# Patient Record
Sex: Female | Born: 1985 | Race: Black or African American | Hispanic: No | Marital: Single | Smoking: Former smoker
Health system: Southern US, Community
[De-identification: ages and names within clinical notes are randomized; demographics above are authoritative.]

## PROBLEM LIST (undated history)

## (undated) DIAGNOSIS — Z8041 Family history of malignant neoplasm of ovary: Secondary | ICD-10-CM

## (undated) DIAGNOSIS — T7840XA Allergy, unspecified, initial encounter: Secondary | ICD-10-CM

## (undated) DIAGNOSIS — I10 Essential (primary) hypertension: Secondary | ICD-10-CM

## (undated) DIAGNOSIS — F419 Anxiety disorder, unspecified: Secondary | ICD-10-CM

## (undated) DIAGNOSIS — K219 Gastro-esophageal reflux disease without esophagitis: Secondary | ICD-10-CM

## (undated) DIAGNOSIS — J45909 Unspecified asthma, uncomplicated: Secondary | ICD-10-CM

## (undated) HISTORY — DX: Gastro-esophageal reflux disease without esophagitis: K21.9

## (undated) HISTORY — DX: Allergy, unspecified, initial encounter: T78.40XA

## (undated) HISTORY — DX: Unspecified asthma, uncomplicated: J45.909

## (undated) HISTORY — DX: Family history of malignant neoplasm of ovary: Z80.41

## (undated) HISTORY — DX: Anxiety disorder, unspecified: F41.9

## (undated) HISTORY — DX: Essential (primary) hypertension: I10

---
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
## 2006-02-09 ENCOUNTER — Emergency Department (HOSPITAL_COMMUNITY): Admission: EM | Admit: 2006-02-09 | Discharge: 2006-02-09 | Payer: Self-pay | Admitting: Family Medicine

## 2006-03-05 ENCOUNTER — Other Ambulatory Visit: Admission: RE | Admit: 2006-03-05 | Discharge: 2006-03-05 | Payer: Self-pay | Admitting: Obstetrics and Gynecology

## 2006-06-20 ENCOUNTER — Emergency Department (HOSPITAL_COMMUNITY): Admission: EM | Admit: 2006-06-20 | Discharge: 2006-06-21 | Payer: Self-pay | Admitting: Emergency Medicine

## 2006-07-29 ENCOUNTER — Other Ambulatory Visit: Admission: RE | Admit: 2006-07-29 | Discharge: 2006-07-29 | Payer: Self-pay | Admitting: Obstetrics and Gynecology

## 2007-04-12 ENCOUNTER — Emergency Department (HOSPITAL_COMMUNITY): Admission: EM | Admit: 2007-04-12 | Discharge: 2007-04-12 | Payer: Self-pay | Admitting: Emergency Medicine

## 2007-07-30 IMAGING — CR DG CHEST 2V
2 series · 2 of 2 positions shown · non-contrast
Comparison: None.

CLINICAL DATA: Sore throat and cough.
 CHEST - 2 VIEW:

[w chest pa]
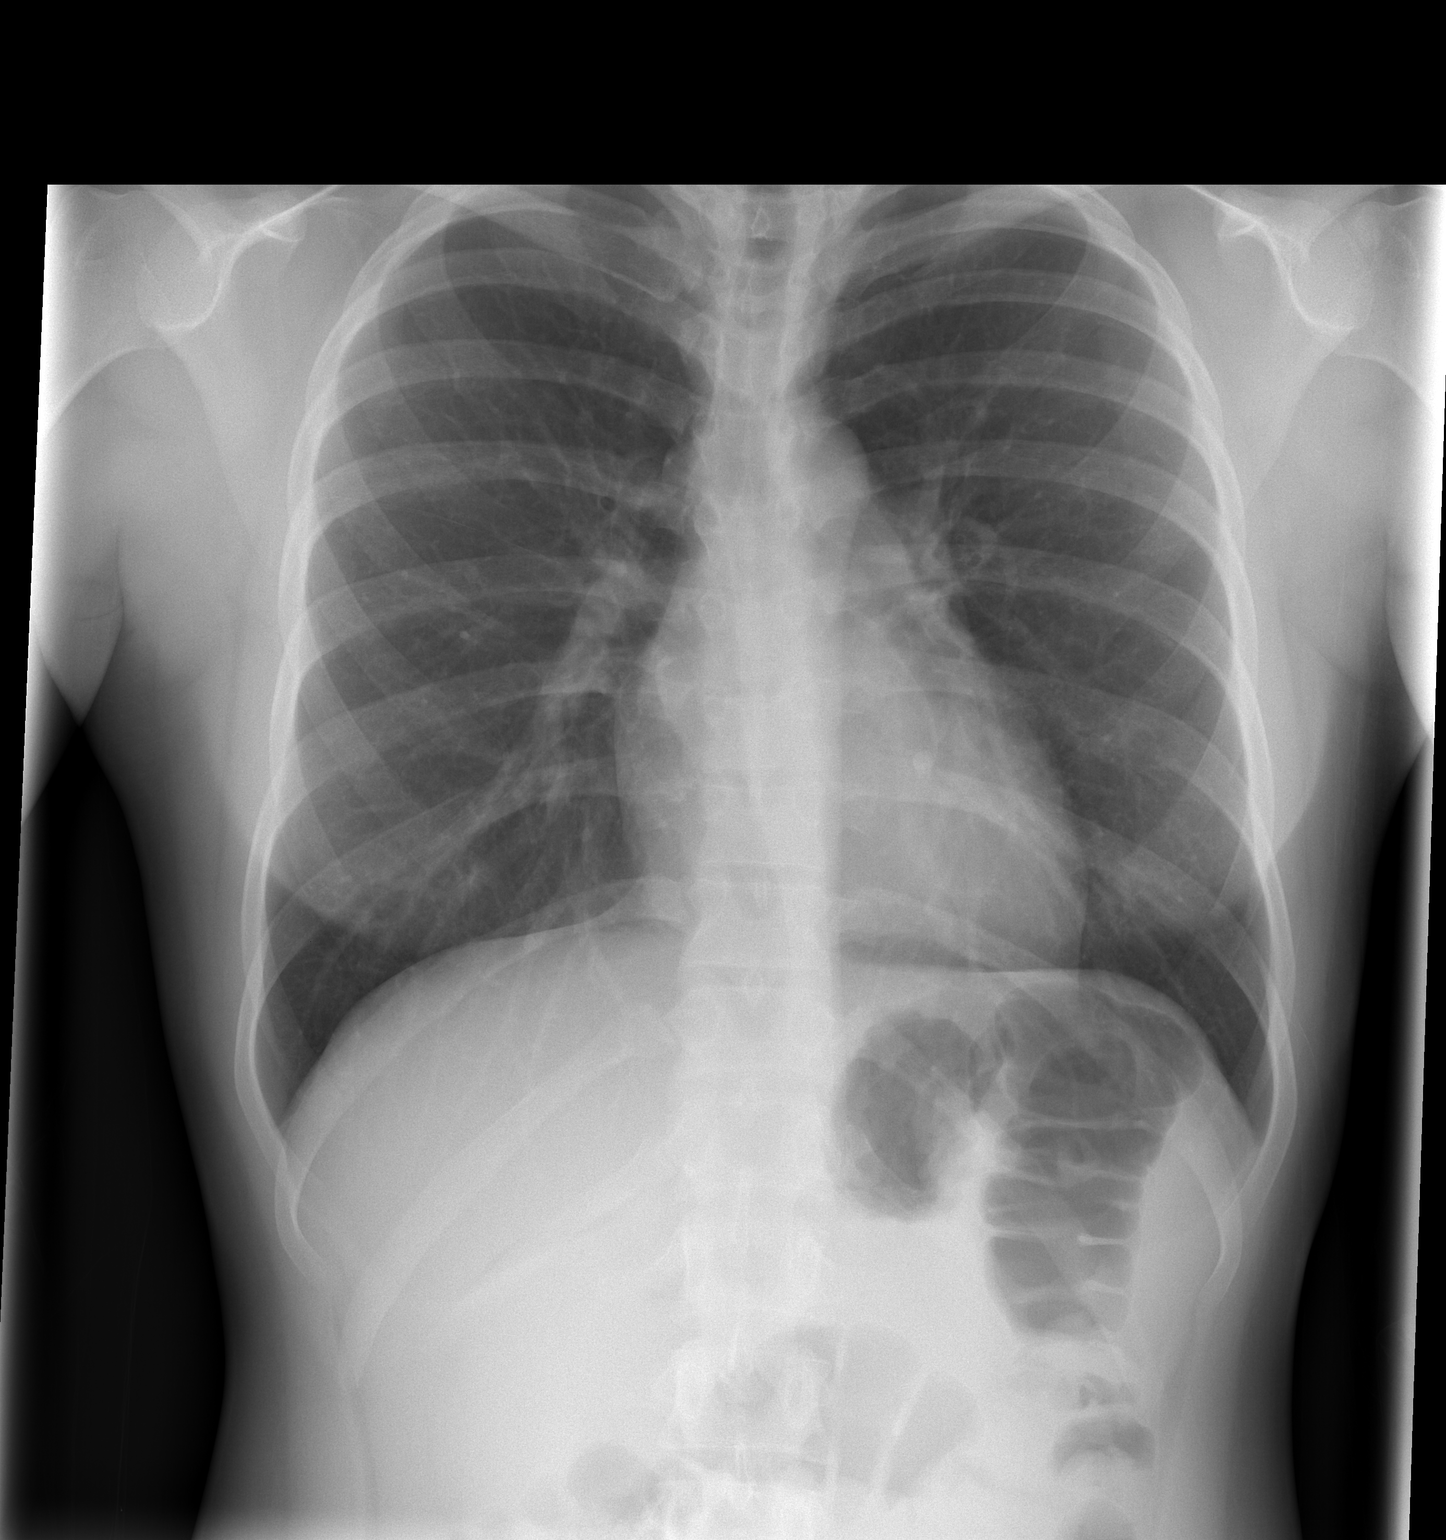

[w chest lat]
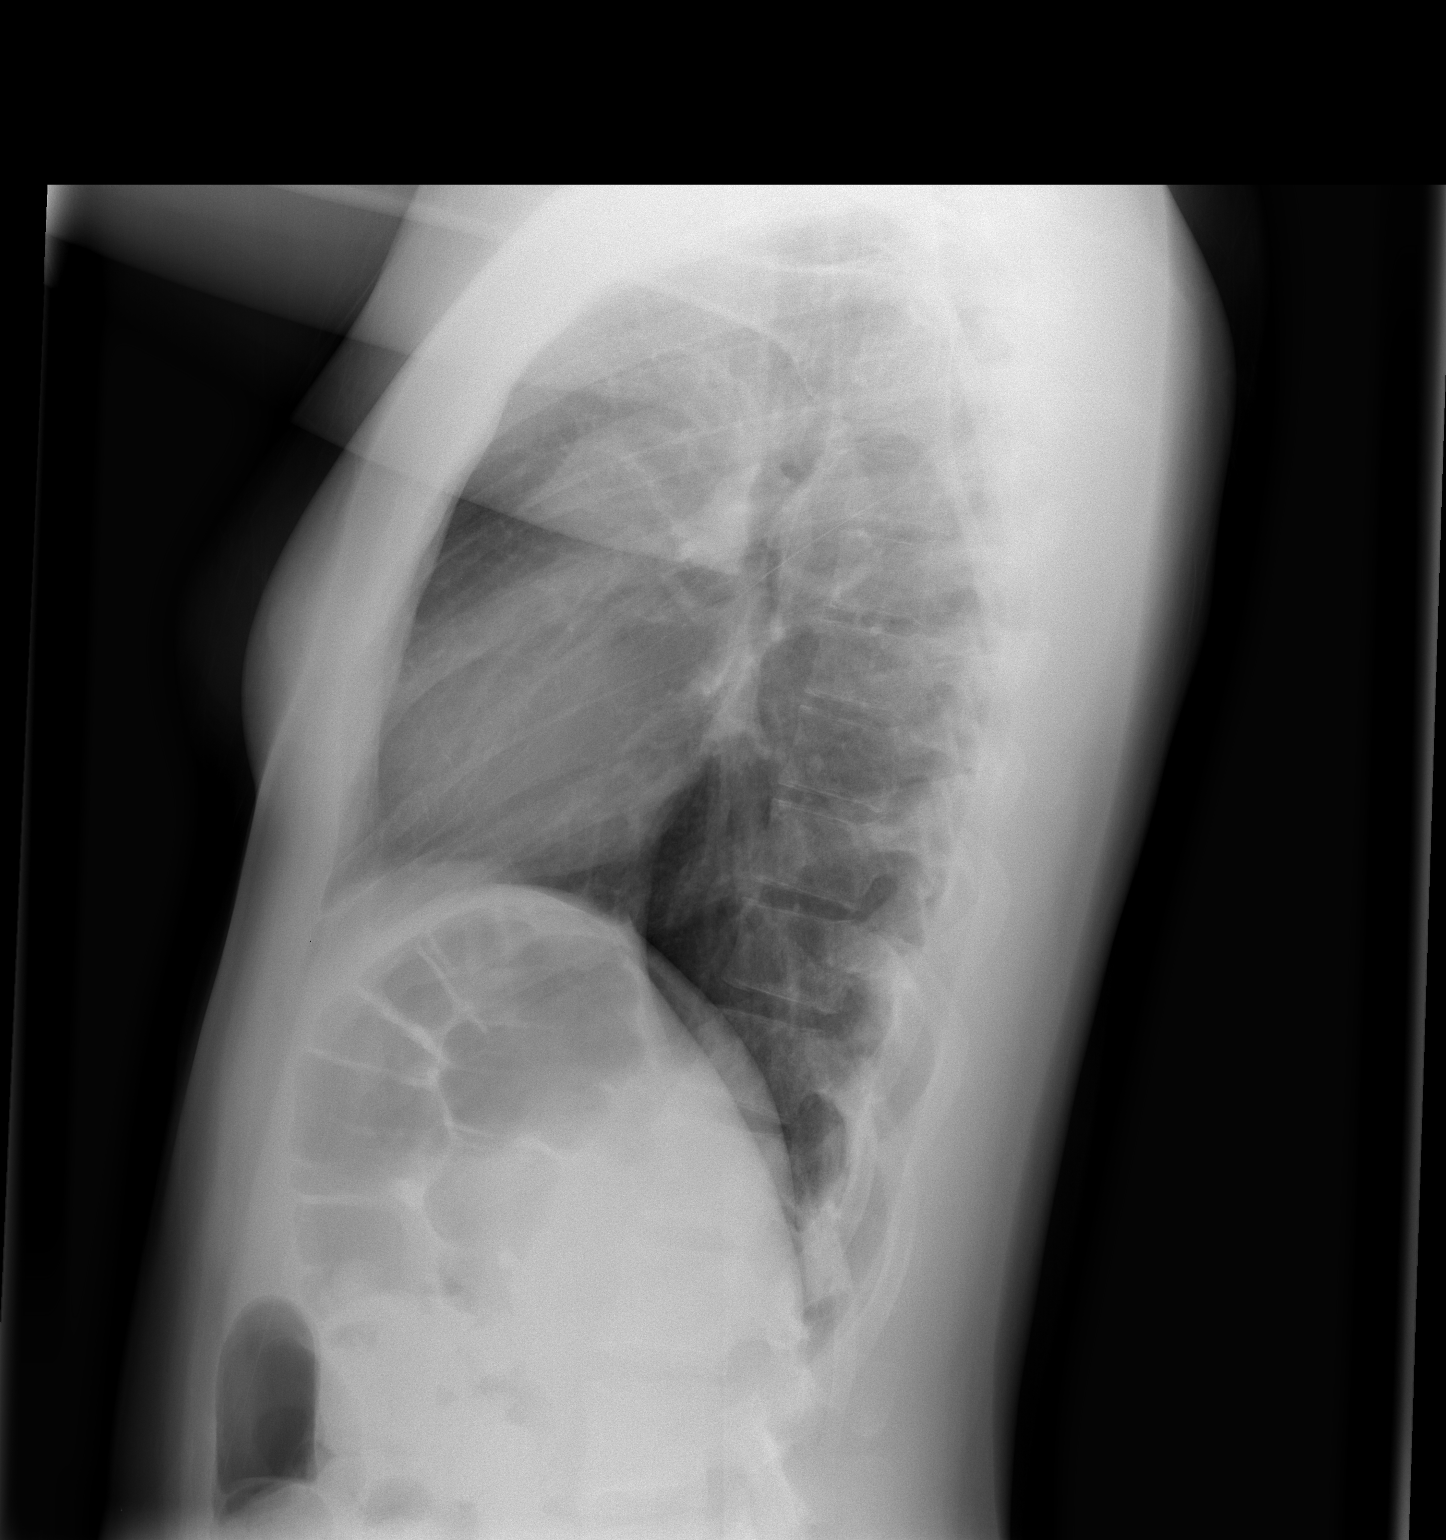

[2 of 2 positions shown; findings below may reference images not displayed]

FINDINGS: Heart size normal.  No effusions or edema.  There are no air space opacities identified.  Review of the visualized osseous structures is unremarkable.
IMPRESSION: No active cardiopulmonary disease.

## 2012-03-21 DEATH — deceased

## 2013-09-01 ENCOUNTER — Ambulatory Visit
Admission: RE | Admit: 2013-09-01 | Discharge: 2013-09-01 | Disposition: A | Discharge status: Home / Self Care | Payer: Commercial Indemnity | Source: Ambulatory Visit | Attending: Physician Assistant | Admitting: Physician Assistant

## 2013-09-01 ENCOUNTER — Other Ambulatory Visit: Payer: Self-pay | Admitting: Physician Assistant

## 2013-09-01 DIAGNOSIS — R52 Pain, unspecified: Secondary | ICD-10-CM

## 2021-04-08 ENCOUNTER — Encounter: Payer: Self-pay | Admitting: Family Medicine

## 2021-04-08 ENCOUNTER — Ambulatory Visit: Payer: No Typology Code available for payment source | Admitting: Family Medicine

## 2021-04-08 ENCOUNTER — Other Ambulatory Visit: Payer: Self-pay

## 2021-04-08 VITALS — BP 122/94 | HR 97 | Temp 98.8°F | Ht 67.0 in | Wt 250.0 lb

## 2021-04-08 DIAGNOSIS — G4733 Obstructive sleep apnea (adult) (pediatric): Secondary | ICD-10-CM | POA: Insufficient documentation

## 2021-04-08 DIAGNOSIS — F419 Anxiety disorder, unspecified: Secondary | ICD-10-CM

## 2021-04-08 DIAGNOSIS — F32A Depression, unspecified: Secondary | ICD-10-CM | POA: Diagnosis not present

## 2021-04-08 DIAGNOSIS — G478 Other sleep disorders: Secondary | ICD-10-CM

## 2021-04-08 NOTE — Progress Notes (Signed)
Primary Care / Sports Medicine Office Visit  Patient Information:  Patient ID: Jodi Conner, female DOB: [birth year] Age: 35 y.o. MRN: [id number]   Jodi Conner is a pleasant 35 y.o. female presenting with the following:  Chief Complaint  Patient presents with   New Patient (Initial Visit)   Establish Care   Anxiety    Taking Ashwaganda with some relief   Insomnia    Has taken melatonin in the past with no relief    Review of Systems pertinent details above   Patient Active Problem List   Diagnosis Date Noted   Anxiety and depression 04/08/2021   Awakens from sleep at night 04/08/2021   Past Medical History:  Diagnosis Date   Allergy    GERD (gastroesophageal reflux disease)    Outpatient Encounter Medications as of 04/08/2021  Medication Sig   ASHWAGANDHA PO Take 2 capsules by mouth daily.   esomeprazole (NEXIUM) 20 MG capsule Take 20 mg by mouth daily at 12 noon.   fexofenadine (ALLEGRA) 180 MG tablet Take 180 mg by mouth daily.   Multiple Vitamins-Minerals (HAIR SKIN AND NAILS FORMULA PO) Take 2 capsules by mouth daily.   No facility-administered encounter medications on file as of 04/08/2021.   History reviewed. No pertinent surgical history.  Vitals:   04/08/21 0943  BP: (!) 122/94  Pulse: 97  Temp: 98.8 F (37.1 C)  SpO2: 98%   Vitals:   04/08/21 0943  Weight: 250 lb (113.4 kg)  Height: 5\' 7"  (1.702 m)   Body mass index is 39.16 kg/m.  No results found.   Independent interpretation of notes and tests performed by another provider:   None  Procedures performed:   None  Pertinent History, Exam, Impression, and Recommendations:   Anxiety and depression GAD 7 : Generalized Anxiety Score 04/08/2021  Nervous, Anxious, on Edge 1  Control/stop worrying 1  Worry too much - different things 1  Trouble relaxing 1  Restless 0  Easily annoyed or irritable 1  Afraid - awful might happen 1  Total GAD 7 Score 6  Anxiety Difficulty Somewhat  difficult     Depression screen PHQ 2/9 04/08/2021  Decreased Interest 1  Down, Depressed, Hopeless 0  PHQ - 2 Score 1  Altered sleeping 3  Tired, decreased energy 1  Change in appetite 1  Feeling bad or failure about yourself  0  Trouble concentrating 0  Moving slowly or fidgety/restless 0  Suicidal thoughts 0  PHQ-9 Score 6  Difficult doing work/chores Somewhat difficult   Patient with above GAD and course, feel that symptoms have progressively worsened over the past few years, primary concern includes sleep, intrusive thoughts noted as well.  We reviewed various strategies both pharmacologic and nonpharmacologic, at this stage she is amenable to cognitive behavioral therapy.  Information for local resources was provided, mindfulness information provided as well.  She is to continue the OTC ashwagandha in the interim. We will track this issue and modify plan accordingly.  Awakens from sleep at night Patient with difficulty with sleep initiation due to racing thoughts, additionally multiple episodes of nighttime awakenings, has been told that she snores by family members.  Epworth score below noted though given her overall clinical picture I have advised further evaluation by ENT/sleep medicine for evaluation for OSA versus additional sleep pathology.  Cardiopulmonary findings were benign today.  We will coordinate a follow-up after she has seen ENT/sleep.  Results of the Epworth flowsheet 04/08/2021  Sitting and reading  1  Watching TV 2  Sitting, inactive in a public place (e.g. a theatre or a meeting) 0  As a passenger in a car for an hour without a break 3  Lying down to rest in the afternoon when circumstances permit 1  Sitting and talking to someone 1  Sitting quietly after a lunch without alcohol 1  In a car, while stopped for a few minutes in traffic 0  Total score 9     Orders & Medications No orders of the defined types were placed in this encounter.  Orders Placed This  Encounter  Procedures   Ambulatory referral to ENT     No follow-ups on file.     Jerrol Banana, MD   Primary Care Sports Medicine [facility]

## 2021-04-08 NOTE — Assessment & Plan Note (Addendum)
GAD 7 : Generalized Anxiety Score 04/08/2021  Nervous, Anxious, on Edge 1  Control/stop worrying 1  Worry too much - different things 1  Trouble relaxing 1  Restless 0  Easily annoyed or irritable 1  Afraid - awful might happen 1  Total GAD 7 Score 6  Anxiety Difficulty Somewhat difficult     Depression screen PHQ 2/9 04/08/2021  Decreased Interest 1  Down, Depressed, Hopeless 0  PHQ - 2 Score 1  Altered sleeping 3  Tired, decreased energy 1  Change in appetite 1  Feeling bad or failure about yourself  0  Trouble concentrating 0  Moving slowly or fidgety/restless 0  Suicidal thoughts 0  PHQ-9 Score 6  Difficult doing work/chores Somewhat difficult   Patient with above GAD and course, feel that symptoms have progressively worsened over the past few years, primary concern includes sleep, intrusive thoughts noted as well.  We reviewed various strategies both pharmacologic and nonpharmacologic, at this stage she is amenable to cognitive behavioral therapy.  Information for local resources was provided, mindfulness information provided as well.  She is to continue the OTC ashwagandha in the interim. We will track this issue and modify plan accordingly.

## 2021-04-08 NOTE — Assessment & Plan Note (Signed)
Patient with difficulty with sleep initiation due to racing thoughts, additionally multiple episodes of nighttime awakenings, has been told that she snores by family members.  Epworth score below noted though given her overall clinical picture I have advised further evaluation by ENT/sleep medicine for evaluation for OSA versus additional sleep pathology.  Cardiopulmonary findings were benign today.  We will coordinate a follow-up after she has seen ENT/sleep.  Results of the Epworth flowsheet 04/08/2021  Sitting and reading 1  Watching TV 2  Sitting, inactive in a public place (e.g. a theatre or a meeting) 0  As a passenger in a car for an hour without a break 3  Lying down to rest in the afternoon when circumstances permit 1  Sitting and talking to someone 1  Sitting quietly after a lunch without alcohol 1  In a car, while stopped for a few minutes in traffic 0  Total score 9

## 2021-04-08 NOTE — Patient Instructions (Addendum)
Please contact to schedule an appointment:  Behavioral Health Resources:  [facility]: [phone] [facility]: [phone] [facility]: [phone] [place] [employer]: [phone] [facility]: [phone] [facility]: [phone]  Vernie Murders MD ENT / Sleep Medicine [address]  [phone]  [employer]: [phone] [facility] : [phone]   Mindfulness-Based Stress Reduction Mindfulness-based stress reduction (MBSR) is a program that helps you learn to practice mindfulness. Mindfulness is the practice of intentionally paying attention to the present moment. MBSR focuses on developing self-awareness, which allows you to respond to life stress without judgment or negative emotions. It can be learned and practiced through techniques such as education, breathing exercises, meditation, and yoga. MBSR includes several mindfulness techniques in one program. MBSR works best when you understand the treatment, are willing to try new things, and can commit to spending time practicing what you learn.  What are the Benefits of MBSR? MBSR has been shown to have many benefits, which include helping you to: Reduce negative emotions, such as depression and anxiety. Improve memory and focus. Change how you sense and approach pain. Boost your body's ability to fight infections. Help you connect better with other people. Improve your sense of well-being. Follow These Instructions at Home:  Find a local in-person or online MBSR program. Set aside some time regularly for mindfulness practice. Find a mindfulness practice that works best for you. This may include one or more of the following: Meditation. Meditation involves focusing your mind on a certain thought or activity. Breathing awareness exercises. These help you to stay present by focusing on your breath. Body scan. For this  practice, you lie down and pay attention to each part of your body from head to toe. You can identify tension and soreness and intentionally relax parts of your body. Yoga. Yoga involves stretching and breathing, and it can improve your ability to move and be flexible. It can also provide an experience of testing your body's limits, which can help you release stress. Mindful eating. This way of eating involves focusing on the taste, texture, color, and smell of each bite of food. Because this slows down eating and helps you feel full sooner, it can be an important part of a weight-loss plan. Find a podcast or recording that provides guidance for breathing awareness, body scan, or meditation exercises. You can listen to these any time when you have a free moment to rest without distractions. Follow your treatment plan as told by your health care provider. This may include taking regular medicines and making changes to your diet or lifestyle as recommended. How to Practice Mindfulness: To do a basic awareness exercise: Find a comfortable place to sit or lay down. Pay attention to the present moment. Observe your thoughts, feelings, and surroundings just as they are. Avoid placing judgment on yourself, your feelings, or your surroundings. Make note of any judgment that comes up, and let it go. Your mind may wander, and that is okay. Make note of when your thoughts drift, and return your attention to the present moment. To do basic mindfulness meditation: Find a comfortable place to sit or lay down. This may include a stable chair, your bed, or a firm floor cushion. Sit upright with your back straight. Let your arms fall next to your side with your hands resting on your legs. If sitting in a chair, rest your feet flat on the floor. If sitting on a cushion, cross your legs in  front of you. If laying down, let your limbs fully relax. Keep your head in a neutral position with your chin dropped slightly.  Relax your jaw and rest the tip of your tongue on the roof of your mouth. Drop your gaze to the floor. You can close your eyes if you like. Breathe normally and pay attention to your breath. Feel the air moving in and out of your nose. Feel your belly expanding and relaxing with each breath. Your mind may wander, and that is okay. Make note of when your thoughts drift, and return your attention to your breath. Avoid placing judgment on yourself, your feelings, or your surroundings. Make note of any judgment or feelings that come up, let them go, and bring your attention back to your breath. When you are ready, lift your gaze or open your eyes. Pay attention to how your body feels after the meditation.

## 2021-04-14 ENCOUNTER — Encounter: Payer: Self-pay | Admitting: Family Medicine

## 2021-04-14 ENCOUNTER — Ambulatory Visit (INDEPENDENT_AMBULATORY_CARE_PROVIDER_SITE_OTHER): Payer: No Typology Code available for payment source | Admitting: Family Medicine

## 2021-04-14 ENCOUNTER — Other Ambulatory Visit: Payer: Self-pay

## 2021-04-14 VITALS — BP 130/84 | HR 94 | Temp 98.7°F | Ht 67.0 in | Wt 250.0 lb

## 2021-04-14 DIAGNOSIS — J309 Allergic rhinitis, unspecified: Secondary | ICD-10-CM | POA: Diagnosis not present

## 2021-04-14 DIAGNOSIS — Z0000 Encounter for general adult medical examination without abnormal findings: Secondary | ICD-10-CM | POA: Diagnosis not present

## 2021-04-14 DIAGNOSIS — R7989 Other specified abnormal findings of blood chemistry: Secondary | ICD-10-CM | POA: Diagnosis not present

## 2021-04-14 DIAGNOSIS — Z23 Encounter for immunization: Secondary | ICD-10-CM | POA: Diagnosis not present

## 2021-04-14 DIAGNOSIS — Z13220 Encounter for screening for lipoid disorders: Secondary | ICD-10-CM | POA: Diagnosis not present

## 2021-04-14 NOTE — Assessment & Plan Note (Signed)
Mildly symptomatic, advised restart of Flonase and Allegra x 2 weeks, then PRN. Has ENT visit for OSA evaluation.

## 2021-04-14 NOTE — Patient Instructions (Signed)
-   Restart Flonas and Allegra, dose daily x 2 weeks - Maintain follow-up with ENT / Sleep Medicine and behavioral health - Obtain labs with orders provided - Our office will contact you with results and we will coordinate follow-up to review the above - Contact us for any questions

## 2021-04-14 NOTE — Progress Notes (Signed)
Annual Physical Exam Visit  Patient Information:  Patient ID: Jodi Conner, female DOB: Aug 24, 1986 Age: 35 y.o. MRN: [id number]   Subjective:   CC: Annual Physical Exam  HPI:  Jodi Conner is here for their annual physical.  I reviewed the past medical history, family history, social history, surgical history, and allergies today and changes were made as necessary.  Please see the problem list section below for additional details.  Past Medical History: Past Medical History:  Diagnosis Date   Allergy    GERD (gastroesophageal reflux disease)    Past Surgical History: History reviewed. No pertinent surgical history. Social History: Social History   Socioeconomic History   Marital status: Single    Spouse name: Not on file   Number of children: 0   Years of education: 16   Highest education level: Bachelor's degree (e.g., BA, AB, BS)  Occupational History   Occupation: Network engineer  Tobacco Use   Smoking status: Former    Types: Cigars   Smokeless tobacco: Never  Vaping Use   Vaping Use: Never used  Substance and Sexual Activity   Alcohol use: Yes    Alcohol/week: 2.0 standard drinks    Types: 2 Standard drinks or equivalent per week   Drug use: Never   Sexual activity: Yes    Birth control/protection: Condom  Other Topics Concern   Not on file  Social History Narrative   Not on file   Social Determinants of Health   Financial Resource Strain: Not on file  Food Insecurity: Not on file  Transportation Needs: Not on file  Physical Activity: Not on file  Stress: Not on file  Social Connections: Not on file   Family History: Family History  Problem Relation Age of Onset   Alcohol abuse Mother    Cervical cancer Mother    Drug abuse Mother    Alcohol abuse Father    Cancer Maternal Grandmother    Allergies: No Known Allergies Health Maintenance: Health Maintenance  Topic Date Due   HIV Screening  Never done   Hepatitis C  Screening  Never done   PAP SMEAR-Modifier  Never done   COVID-19 Vaccine (3 - Booster for Pfizer series) 05/27/2020   INFLUENZA VACCINE  04/21/2021   TETANUS/TDAP  04/15/2031   Pneumococcal Vaccine 66-62 Years old  Aged Out   HPV VACCINES  Aged Out    HM Colonoscopy     This patient has no relevant Health Maintenance data.      Medications: Current Outpatient Medications on File Prior to Visit  Medication Sig Dispense Refill   ASHWAGANDHA PO Take 2 capsules by mouth daily.     esomeprazole (NEXIUM) 20 MG capsule Take 20 mg by mouth daily at 12 noon.     fexofenadine (ALLEGRA) 180 MG tablet Take 180 mg by mouth daily.     Multiple Vitamins-Minerals (HAIR SKIN AND NAILS FORMULA PO) Take 2 capsules by mouth daily.     No current facility-administered medications on file prior to visit.    Review of Systems: No headache, visual changes, nausea, vomiting, diarrhea, constipation, dizziness, abdominal pain, skin rash, fevers, chills, night sweats, swollen lymph nodes, weight loss, chest pain, body aches, joint swelling, muscle aches, +shortness of breath, +allergies, mood changes, visual or auditory hallucinations reported.  Objective:   Vitals:   04/14/21 0819  BP: 130/84  Pulse: 94  Temp: 98.7 F (37.1 C)  SpO2: 100%   Vitals:   04/14/21 7741  Weight: 250 lb (113.4 kg)  Height: 5\' 7"  (1.702 m)   Body mass index is 39.16 kg/m.  General: Well Developed, well nourished, and in no acute distress.  Neuro: Alert and oriented x3, extra-ocular muscles intact, sensation grossly intact. Cranial nerves II through XII are intact, motor, sensory, and coordinative functions are all intact. HEENT: Normocephalic, atraumatic, pupils equal round reactive to light, neck supple, no masses, nontender mildly swollen LN right anterior cervical chain, thyroid nonpalpable. Oropharynx, nasopharynx, external ear canals are unremarkable. Skin: Warm and dry, no rashes noted.  Cardiac: Regular rate and  rhythm, no murmurs rubs or gallops. No peripheral edema. Pulses symmetric. Respiratory: Trace coarse breath sounds upper fields, post-cough clear to auscultation bilaterally. Not using accessory muscles, speaking in full sentences.  Abdominal: Soft, nontender, nondistended, positive bowel sounds, no masses, no organomegaly.  Musculoskeletal: Shoulder, elbow, wrist, hip, knee, ankle stable, and with full range of motion. Mild tenderness pectoralis major/minor origin bilaterally.  Female chaperone initials: BN present throughout the physical examination.  Impression and Recommendations:   The patient was counselled, risk factors were discussed, and anticipatory guidance given.  Allergic rhinitis Mildly symptomatic, advised restart of Flonase and Allegra x 2 weeks, then PRN. Has ENT visit for OSA evaluation.  Encounter for annual physical exam Annual physical exam completed, anticipatory guidance provided, and risk stratification labs ordered. Will follow as clinically guided. Does have ENT / sleep medicine OSA evaluation pending. Does have behavioral health follow-up pending.   Orders & Medications No orders of the defined types were placed in this encounter.  Orders Placed This Encounter  Procedures   Tdap vaccine greater than or equal to 7yo IM   Lipid panel   CBC   Comprehensive metabolic panel   VITAMIN D 25 Hydroxy (Vit-D Deficiency, Fractures)   TSH Rfx on Abnormal to Free T4   HIV antibody (with reflex)   Hepatitis C Antibody     Return if symptoms worsen or fail to improve.    , MD   Primary Care Sports Medicine [facility]

## 2021-04-14 NOTE — Assessment & Plan Note (Signed)
Annual physical exam completed, anticipatory guidance provided, and risk stratification labs ordered. Will follow as clinically guided. Does have ENT / sleep medicine OSA evaluation pending. Does have behavioral health follow-up pending.

## 2021-04-15 ENCOUNTER — Other Ambulatory Visit: Payer: Self-pay | Admitting: Family Medicine

## 2021-04-15 ENCOUNTER — Telehealth: Payer: Self-pay

## 2021-04-15 DIAGNOSIS — R718 Other abnormality of red blood cells: Secondary | ICD-10-CM

## 2021-04-15 DIAGNOSIS — R7301 Impaired fasting glucose: Secondary | ICD-10-CM

## 2021-04-15 DIAGNOSIS — R7989 Other specified abnormal findings of blood chemistry: Secondary | ICD-10-CM

## 2021-04-15 LAB — CBC
Hematocrit: 33.9 % — ABNORMAL LOW (ref 34.0–46.6)
Hemoglobin: 11 g/dL — ABNORMAL LOW (ref 11.1–15.9)
MCH: 25.4 pg — ABNORMAL LOW (ref 26.6–33.0)
MCHC: 32.4 g/dL (ref 31.5–35.7)
MCV: 78 fL — ABNORMAL LOW (ref 79–97)
Platelets: 417 10*3/uL (ref 150–450)
RBC: 4.33 x10E6/uL (ref 3.77–5.28)
RDW: 15.8 % — ABNORMAL HIGH (ref 11.7–15.4)
WBC: 8.8 10*3/uL (ref 3.4–10.8)

## 2021-04-15 LAB — COMPREHENSIVE METABOLIC PANEL
ALT: 15 IU/L (ref 0–32)
AST: 14 IU/L (ref 0–40)
Albumin/Globulin Ratio: 1.5 (ref 1.2–2.2)
Albumin: 4.1 g/dL (ref 3.8–4.8)
Alkaline Phosphatase: 67 IU/L (ref 44–121)
BUN/Creatinine Ratio: 14 (ref 9–23)
BUN: 12 mg/dL (ref 6–20)
Bilirubin Total: <0.2 mg/dL (ref 0.0–1.2)
CO2: 23 mmol/L (ref 20–29)
Calcium: 9 mg/dL (ref 8.7–10.2)
Chloride: 104 mmol/L (ref 96–106)
Creatinine, Ser: 0.86 mg/dL (ref 0.57–1.00)
Globulin, Total: 2.8 g/dL (ref 1.5–4.5)
Glucose: 111 mg/dL — ABNORMAL HIGH (ref 65–99)
Potassium: 4.3 mmol/L (ref 3.5–5.2)
Sodium: 140 mmol/L (ref 134–144)
Total Protein: 6.9 g/dL (ref 6.0–8.5)
eGFR: 91 mL/min/{1.73_m2} (ref >59)

## 2021-04-15 LAB — LIPID PANEL
Chol/HDL Ratio: 5.1 ratio — ABNORMAL HIGH (ref 0.0–4.4)
Cholesterol, Total: 159 mg/dL (ref 100–199)
HDL: 31 mg/dL — ABNORMAL LOW (ref >39)
LDL Chol Calc (NIH): 107 mg/dL — ABNORMAL HIGH (ref 0–99)
Triglycerides: 113 mg/dL (ref 0–149)
VLDL Cholesterol Cal: 21 mg/dL (ref 5–40)

## 2021-04-15 LAB — VITAMIN D 25 HYDROXY (VIT D DEFICIENCY, FRACTURES): Vit D, 25-Hydroxy: 20.3 ng/mL — ABNORMAL LOW (ref 30.0–100.0)

## 2021-04-15 LAB — HIV ANTIBODY (ROUTINE TESTING W REFLEX): HIV Screen 4th Generation wRfx: NONREACTIVE

## 2021-04-15 LAB — TSH RFX ON ABNORMAL TO FREE T4: TSH: 2.34 u[IU]/mL (ref 0.450–4.500)

## 2021-04-15 LAB — HEPATITIS C ANTIBODY: Hep C Virus Ab: 0.2 s/co ratio (ref 0.0–0.9)

## 2021-04-15 MED ORDER — VITAMIN D (ERGOCALCIFEROL) 1.25 MG (50000 UNIT) PO CAPS: 50000.0000 [IU] | ORAL_CAPSULE | ORAL | 0 refills | Status: DC

## 2021-04-15 NOTE — Telephone Encounter (Signed)
-----   Message from Jerrol Banana, MD sent at 04/15/2021  8:46 AM EDT ----- Patient needs follow-up in 1-2 months. Orders placed for labs to obtain a few days prior to visit for review at visit.  JJM   Jaela the labs came back with a few results to follow-up on.  1. The lipid panel shows that your "good" cholesterol is a bit low and your "bad" cholesterol is slightly high. I recommend starting a omega 3 supplement and cutting back on any high cholesterol foods (full fat dairy, yogurt, cheese, red meat, processed foods, baked goods/sweets, etc) where applicable.  2. Your blood count has results that show you may be anemic. We will need to get an iron panel and we will discuss additional steps at follow-up.   3. The metabolic panel shows that your glucose (sugar) number was high, as this was a fasting test, this is a concern for diabetes, we can confirm this with a hemoglobin A1c test. Your other panel results were great!  4. The vitamin D was low enough that we need to start a Rx weekly course of vitamin D and this was sent to your pharmacy.  5. The other tests all came back normal. I will order a few more labs (no need to be fasting) for you to get done over the next month or so and we will set up a follow-up in that 1-2 month timeframe to review results and discuss next steps.  Please reach out for any questions and someone from our office will contact you for scheduling.

## 2021-04-15 NOTE — Telephone Encounter (Signed)
Patient scheduled 05/30/21.  For your information.

## 2021-04-15 NOTE — Telephone Encounter (Signed)
Left message

## 2021-04-15 NOTE — Telephone Encounter (Addendum)
Patient viewed results via MyChart.  Please schedule follow-up in 1-2 months per Dr. Ashley Royalty.  Lab orders put in basket up front for patient to get ahead of time.  These are not fasting.

## 2021-04-15 NOTE — Progress Notes (Signed)
Patient needs follow-up in 1-2 months. Orders placed for labs to obtain a few days prior to visit for review at visit.  JJM   Pinki the labs came back with a few results to follow-up on.  1. The lipid panel shows that your "good" cholesterol is a bit low and your "bad" cholesterol is slightly high. I recommend starting a omega 3 supplement and cutting back on any high cholesterol foods (full fat dairy, yogurt, cheese, red meat, processed foods, baked goods/sweets, etc) where applicable.  2. Your blood count has results that show you may be anemic. We will need to get an iron panel and we will discuss additional steps at follow-up.   3. The metabolic panel shows that your glucose (sugar) number was high, as this was a fasting test, this is a concern for diabetes, we can confirm this with a hemoglobin A1c test. Your other panel results were great!  4. The vitamin D was low enough that we need to start a Rx weekly course of vitamin D and this was sent to your pharmacy.  5. The other tests all came back normal. I will order a few more labs (no need to be fasting) for you to get done over the next month or so and we will set up a follow-up in that 1-2 month timeframe to review results and discuss next steps.  Please reach out for any questions and someone from our office will contact you for scheduling.

## 2021-04-30 ENCOUNTER — Other Ambulatory Visit: Payer: Self-pay

## 2021-04-30 ENCOUNTER — Other Ambulatory Visit (HOSPITAL_COMMUNITY)
Admission: RE | Admit: 2021-04-30 | Discharge: 2021-04-30 | Disposition: A | Discharge status: Home / Self Care | Payer: Commercial Indemnity | Source: Ambulatory Visit | Attending: Advanced Practice Midwife | Admitting: Advanced Practice Midwife

## 2021-04-30 ENCOUNTER — Encounter: Payer: Self-pay | Admitting: Advanced Practice Midwife

## 2021-04-30 ENCOUNTER — Ambulatory Visit (INDEPENDENT_AMBULATORY_CARE_PROVIDER_SITE_OTHER): Payer: No Typology Code available for payment source | Admitting: Advanced Practice Midwife

## 2021-04-30 VITALS — BP 142/97 | HR 99 | Ht 67.0 in | Wt 247.0 lb

## 2021-04-30 DIAGNOSIS — Z124 Encounter for screening for malignant neoplasm of cervix: Secondary | ICD-10-CM | POA: Diagnosis not present

## 2021-04-30 DIAGNOSIS — Z113 Encounter for screening for infections with a predominantly sexual mode of transmission: Secondary | ICD-10-CM | POA: Diagnosis not present

## 2021-04-30 DIAGNOSIS — Z01419 Encounter for gynecological examination (general) (routine) without abnormal findings: Secondary | ICD-10-CM | POA: Diagnosis not present

## 2021-04-30 DIAGNOSIS — Z30011 Encounter for initial prescription of contraceptive pills: Secondary | ICD-10-CM

## 2021-04-30 MED ORDER — NORGESTIMATE-ETH ESTRADIOL 0.25-35 MG-MCG PO TABS: 1.0000 | ORAL_TABLET | Freq: Every day | ORAL | 3 refills | Status: DC

## 2021-04-30 NOTE — Progress Notes (Signed)
Gynecology Annual Exam   PCP: Jerrol Banana, MD  Chief Complaint:  Chief Complaint  Patient presents with   Gynecologic Exam    Annual - discuss irregular menstrual, discuss OCP options. RM 3    History of Present Illness: Patient is a 35 y.o. G0P0000 presents for annual exam. The patient has no gyn complaints today. She does mention a recently stressful period of time with new job and new home and she did not have a period for 2 months. Her last PAP smear was an unknown number of years ago and was normal per patient report. She requests OCP today for both cycle and birth control. She reports smoking 1 or 2 cigars per year and understands associated risks with OCP. She thinks she will be able to quit.   LMP: Patient's last menstrual period was 04/01/2021. Average Interval: regular, 28 days Duration of flow:  5-7  days Heavy Menses: starts heavy Clots: no Intermenstrual Bleeding: no Postcoital Bleeding: no Dysmenorrhea: no  The patient is sexually active. She currently uses condoms for contraception. She denies dyspareunia.  The patient does perform self breast exams.  There is no notable family history of breast or ovarian cancer in her family. She reports her maternal grandmother may have had cervical or ovarian or uterine cancer- she is unsure.   The patient wears seatbelts: yes.   The patient has regular exercise: she recently started yoga, she is working with Dr Ashley Royalty of [facility] her new PCP on blood pressure, healthy lifestyle changes, she has also just started a sleep study.    The patient denies current symptoms of depression.  She reports good coping techniques and Ashwaganda is also helping symptoms.  Review of Systems: Review of Systems  Constitutional:  Negative for chills and fever.  HENT:  Negative for congestion, ear discharge, ear pain, hearing loss, sinus pain and sore throat.   Eyes:  Negative for blurred vision and double vision.  Respiratory:   Negative for cough, shortness of breath and wheezing.   Cardiovascular:  Negative for chest pain, palpitations and leg swelling.  Gastrointestinal:  Negative for abdominal pain, blood in stool, constipation, diarrhea, heartburn, melena, nausea and vomiting.  Genitourinary:  Negative for dysuria, flank pain, frequency, hematuria and urgency.  Musculoskeletal:  Negative for back pain, joint pain and myalgias.  Skin:  Negative for itching and rash.  Neurological:  Negative for dizziness, tingling, tremors, sensory change, speech change, focal weakness, seizures, loss of consciousness, weakness and headaches.  Endo/Heme/Allergies:  Negative for environmental allergies. Does not bruise/bleed easily.  Psychiatric/Behavioral:  Negative for depression, hallucinations, memory loss, substance abuse and suicidal ideas. The patient is not nervous/anxious and does not have insomnia.    Past Medical History:  Patient Active Problem List   Diagnosis Date Noted   Encounter for annual physical exam 04/14/2021   Allergic rhinitis 04/14/2021   Anxiety and depression 04/08/2021   Awakens from sleep at night 04/08/2021    Past Surgical History:  History reviewed. No pertinent surgical history.  Gynecologic History:  Patient's last menstrual period was 04/01/2021. Contraception: condoms Last Pap: Results were: no abnormalities   Obstetric History: G0P0000  Family History:  Family History  Problem Relation Age of Onset   Alcohol abuse Mother    Cervical cancer Mother    Drug abuse Mother    Alcohol abuse Father    Cancer Maternal Grandmother     Social History:  Social History   Socioeconomic History  Marital status: Single    Spouse name: Not on file   Number of children: 0   Years of education: 16   Highest education level: Bachelor's degree (e.g., BA, AB, BS)  Occupational History   Occupation: Network engineer  Tobacco Use   Smoking status: Some Days    Types: Cigars    Smokeless tobacco: Never  Vaping Use   Vaping Use: Never used  Substance and Sexual Activity   Alcohol use: Yes    Alcohol/week: 2.0 standard drinks    Types: 2 Standard drinks or equivalent per week   Drug use: Never   Sexual activity: Yes    Birth control/protection: Condom  Other Topics Concern   Not on file  Social History Narrative   Not on file   Social Determinants of Health   Financial Resource Strain: Not on file  Food Insecurity: Not on file  Transportation Needs: Not on file  Physical Activity: Not on file  Stress: Not on file  Social Connections: Not on file  Intimate Partner Violence: Not on file    Allergies:  No Known Allergies  Medications: Prior to Admission medications   Medication Sig Start Date End Date Taking? Authorizing Provider  ASHWAGANDHA PO Take 2 capsules by mouth daily.   Yes [provider]  esomeprazole (NEXIUM) 20 MG capsule Take 20 mg by mouth daily at 12 noon.   Yes [provider]  fexofenadine (ALLEGRA) 180 MG tablet Take 180 mg by mouth daily.   Yes [provider]  Multiple Vitamins-Minerals (HAIR SKIN AND NAILS FORMULA PO) Take 2 capsules by mouth daily.   Yes [provider]  norgestimate-ethinyl estradiol (ORTHO-CYCLEN) 0.25-35 MG-MCG tablet Take 1 tablet by mouth daily. 04/30/21  Yes Tresea Mall, CNM  Vitamin D, Ergocalciferol, (DRISDOL) 1.25 MG (50000 UNIT) CAPS capsule Take 1 capsule (50,000 Units total) by mouth every 7 (seven) days. Take for 8 total doses(weeks) 04/15/21  Yes Jerrol Banana, MD    Physical Exam Vitals: Blood pressure (!) 142/97, pulse 99, height 5\' 7"  (1.702 m), weight 247 lb (112 kg), last menstrual period 04/01/2021.  General: NAD HEENT: normocephalic, anicteric Thyroid: no enlargement, no palpable nodules Pulmonary: No increased work of breathing, CTAB Cardiovascular: RRR, distal pulses 2+ Breast: Breast symmetrical, no tenderness, no palpable nodules or masses, no  skin or nipple retraction present, no nipple discharge.  No axillary or supraclavicular lymphadenopathy. Abdomen: NABS, soft, non-tender, non-distended.  Umbilicus without lesions.  No hepatomegaly, splenomegaly or masses palpable. No evidence of hernia  Genitourinary:  External: Normal external female genitalia.  Normal urethral meatus, normal Bartholin's and Skene's glands.    Vagina: Normal vaginal mucosa, no evidence of prolapse.    Cervix: Grossly normal in appearance, no bleeding, no CMT  Lymphatic: no evidence of inguinal lymphadenopathy Extremities: no edema, erythema, or tenderness Neurologic: Grossly intact Psychiatric: mood appropriate, affect full   Assessment: 35 y.o. G0P0000 routine annual exam  Plan: Problem List Items Addressed This Visit   None Visit Diagnoses     Well woman exam with routine gynecological exam    -  Primary   Relevant Medications   norgestimate-ethinyl estradiol (ORTHO-CYCLEN) 0.25-35 MG-MCG tablet   Other Relevant Orders   Cytology - PAP   Screen for sexually transmitted diseases       Relevant Orders   Cytology - PAP   Screening for cervical cancer       Relevant Orders   Cytology - PAP   Encounter for  initial prescription of contraceptive pills       Relevant Medications   norgestimate-ethinyl estradiol (ORTHO-CYCLEN) 0.25-35 MG-MCG tablet       1) STI screening  was offered and accepted  2)  ASCCP guidelines and rationale discussed.  Patient opts for every 3 years screening interval  3) Contraception - the patient is currently using  condoms.  She is interested in changing to OCP  4) Routine healthcare maintenance including cholesterol, diabetes screening discussed managed by PCP  5) Return in about 1 year (around 04/30/2022) for annual established gyn.   Tresea Mall, CNM [facility] 04/30/2021, 12:25 PM

## 2021-04-30 NOTE — Patient Instructions (Signed)
American Heart Association (AHA) Exercise Recommendation  Being physically active is important to prevent heart disease and stroke, the nation's No. 1and No. 5killers. To improve overall cardiovascular health, we suggest at least 150 minutes per week of moderate exercise or 75 minutes per week of vigorous exercise (or a combination of moderate and vigorous activity). Thirty minutes a day, five times a week is an easy goal to remember. You will also experience benefits even if you divide your time into two or three segments of 10 to 15 minutes per day.  For people who would benefit from lowering their blood pressure or cholesterol, we recommend 40 minutes of aerobic exercise of moderate to vigorous intensity three to four times a week to lower the risk for heart attack and stroke.  Physical activity is anything that makes you move your body and burn calories.  This includes things like climbing stairs or playing sports. Aerobic exercises benefit your heart, and include walking, jogging, swimming or biking. Strength and stretching exercises are best for overall stamina and flexibility.  The simplest, positive change you can make to effectively improve your heart health is to start walking. It's enjoyable, free, easy, social and great exercise. A walking program is flexible and boasts high success rates because people can stick with it. It's easy for walking to become a regular and satisfying part of life.   For Overall Cardiovascular Health: At least 30 minutes of moderate-intensity aerobic activity at least 5 days per week for a total of 150  OR  At least 25 minutes of vigorous aerobic activity at least 3 days per week for a total of 75 minutes; or a combination of moderate- and vigorous-intensity aerobic activity  AND  Moderate- to high-intensity muscle-strengthening activity at least 2 days per week for additional health benefits.  For Lowering Blood Pressure and Cholesterol An average 40  minutes of moderate- to vigorous-intensity aerobic activity 3 or 4 times per week  What if I can't make it to the time goal? Something is always better than nothing! And everyone has to start somewhere. Even if you've been sedentary for years, today is the day you can begin to make healthy changes in your life. If you don't think you'll make it for 30 or 40 minutes, set a reachable goal for today. You can work up toward your overall goal by increasing your time as you get stronger. Don't let all-or-nothing thinking rob you of doing what you can every day.  Source:http://www.heart.org   Mediterranean Diet A Mediterranean diet refers to food and lifestyle choices that are based on the traditions of countries located on the [employer] [place]. This way of eating has been shown to help prevent certain conditions and improve outcomes forpeople who have chronic diseases, like kidney disease and heart disease. What are tips for following this plan? Lifestyle Cook and eat meals together with your family, when possible. Drink enough fluid to keep your urine clear or pale yellow. Be physically active every day. This includes: Aerobic exercise like running or swimming. Leisure activities like gardening, walking, or housework. Get 7-8 hours of sleep each night. If recommended by your health care provider, drink red wine in moderation. This means 1 glass a day for nonpregnant women and 2 glasses a day for men. A glass of wine equals 5 oz (150 mL). Reading food labels  Check the serving size of packaged foods. For foods such as rice and pasta, the serving size refers to the amount of cooked product,  not dry. Check the total fat in packaged foods. Avoid foods that have saturated fat or trans fats. Check the ingredients list for added sugars, such as corn syrup.  Shopping At the grocery store, buy most of your food from the areas near the walls of the store. This includes: Fresh fruits and vegetables  (produce). Grains, beans, nuts, and seeds. Some of these may be available in unpackaged forms or large amounts (in bulk). Fresh seafood. Poultry and eggs. Low-fat dairy products. Buy whole ingredients instead of prepackaged foods. Buy fresh fruits and vegetables in-season from local farmers markets. Buy frozen fruits and vegetables in resealable bags. If you do not have access to quality fresh seafood, buy precooked frozen shrimp or canned fish, such as tuna, salmon, or sardines. Buy small amounts of raw or cooked vegetables, salads, or olives from the deli or salad bar at your store. Stock your pantry so you always have certain foods on hand, such as olive oil, canned tuna, canned tomatoes, rice, pasta, and beans. Cooking Cook foods with extra-virgin olive oil instead of using butter or other vegetable oils. Have meat as a side dish, and have vegetables or grains as your main dish. This means having meat in small portions or adding small amounts of meat to foods like pasta or stew. Use beans or vegetables instead of meat in common dishes like chili or lasagna. Experiment with different cooking methods. Try roasting or broiling vegetables instead of steaming or sauteing them. Add frozen vegetables to soups, stews, pasta, or rice. Add nuts or seeds for added healthy fat at each meal. You can add these to yogurt, salads, or vegetable dishes. Marinate fish or vegetables using olive oil, lemon juice, garlic, and fresh herbs. Meal planning  Plan to eat 1 vegetarian meal one day each week. Try to work up to 2 vegetarian meals, if possible. Eat seafood 2 or more times a week. Have healthy snacks readily available, such as: Vegetable sticks with hummus. Greek yogurt. Fruit and nut trail mix. Eat balanced meals throughout the week. This includes: Fruit: 2-3 servings a day Vegetables: 4-5 servings a day Low-fat dairy: 2 servings a day Fish, poultry, or lean meat: 1 serving a day Beans and  legumes: 2 or more servings a week Nuts and seeds: 1-2 servings a day Whole grains: 6-8 servings a day Extra-virgin olive oil: 3-4 servings a day Limit red meat and sweets to only a few servings a month  What are my food choices? Mediterranean diet Recommended Grains: Whole-grain pasta. Brown rice. Bulgar wheat. Polenta. Couscous. Whole-wheat bread. Orpah Cobb. Vegetables: Artichokes. Beets. Broccoli. Cabbage. Carrots. Eggplant. Green beans. Chard. Kale. Spinach. Onions. Leeks. Peas. Squash. Tomatoes. Peppers. Radishes. Fruits: Apples. Apricots. Avocado. Berries. Bananas. Cherries. Dates. Figs. Grapes. Lemons. Melon. Oranges. Peaches. Plums. Pomegranate. Meats and other protein foods: Beans. Almonds. Sunflower seeds. Pine nuts. Peanuts. Cod. Salmon. Scallops. Shrimp. Tuna. Tilapia. Clams. Oysters. Eggs. Dairy: Low-fat milk. Cheese. Greek yogurt. Beverages: Water. Red wine. Herbal tea. Fats and oils: Extra virgin olive oil. Avocado oil. Grape seed oil. Sweets and desserts: [place] yogurt with honey. Baked apples. Poached pears. Trail mix. Seasoning and other foods: Basil. Cilantro. Coriander. Cumin. Mint. Parsley. Sage. Rosemary. Tarragon. Garlic. Oregano. Thyme. Pepper. Balsalmic vinegar. Tahini. Hummus. Tomato sauce. Olives. Mushrooms. Limit these Grains: Prepackaged pasta or rice dishes. Prepackaged cereal with added sugar. Vegetables: Deep fried potatoes (french fries). Fruits: Fruit canned in syrup. Meats and other protein foods: Beef. Pork. Lamb. Poultry with skin. Hot dogs. Tomasa Blase. Dairy:  Ice cream. Sour cream. Whole milk. Beverages: Juice. Sugar-sweetened soft drinks. Beer. Liquor and spirits. Fats and oils: Butter. Canola oil. Vegetable oil. Beef fat (tallow). Lard. Sweets and desserts: Cookies. Cakes. Pies. Candy. Seasoning and other foods: Mayonnaise. Premade sauces and marinades. The items listed may not be a complete list. Talk with your dietitian aboutwhat dietary choices  are right for you. Summary The Mediterranean diet includes both food and lifestyle choices. Eat a variety of fresh fruits and vegetables, beans, nuts, seeds, and whole grains. Limit the amount of red meat and sweets that you eat. Talk with your health care provider about whether it is safe for you to drink red wine in moderation. This means 1 glass a day for nonpregnant women and 2 glasses a day for men. A glass of wine equals 5 oz (150 mL). This information is not intended to replace advice given to you by your health care provider. Make sure you discuss any questions you have with your healthcare provider. Document Revised: 05/07/2016 Document Reviewed: 04/30/2016 Elsevier Patient Education  2020 [employer] Health Maintenance, Female Adopting a healthy lifestyle and getting preventive care are important in promoting health and wellness. Ask your health care provider about: The right schedule for you to have regular tests and exams. Things you can do on your own to prevent diseases and keep yourself healthy. What should I know about diet, weight, and exercise? Eat a healthy diet  Eat a diet that includes plenty of vegetables, fruits, low-fat dairy products, and lean protein. Do not eat a lot of foods that are high in solid fats, added sugars, or sodium.  Maintain a healthy weight Body mass index (BMI) is used to identify weight problems. It estimates body fat based on height and weight. Your health care provider can help determineyour BMI and help you achieve or maintain a healthy weight. Get regular exercise Get regular exercise. This is one of the most important things you can do for your health. Most adults should: Exercise for at least 150 minutes each week. The exercise should increase your heart rate and make you sweat (moderate-intensity exercise). Do strengthening exercises at least twice a week. This is in addition to the moderate-intensity exercise. Spend less time sitting.  Even light physical activity can be beneficial. Watch cholesterol and blood lipids Have your blood tested for lipids and cholesterol at 34 years of age, then havethis test every 5 years. Have your cholesterol levels checked more often if: Your lipid or cholesterol levels are high. You are older than 34 years of age. You are at high risk for heart disease. What should I know about cancer screening? Depending on your health history and family history, you may need to have cancer screening at various ages. This may include screening for: Breast cancer. Cervical cancer. Colorectal cancer. Skin cancer. Lung cancer. What should I know about heart disease, diabetes, and high blood pressure? Blood pressure and heart disease High blood pressure causes heart disease and increases the risk of stroke. This is more likely to develop in people who have high blood pressure readings, are of African descent, or are overweight. Have your blood pressure checked: Every 3-5 years if you are 2518-34 years of age. Every year if you are 34 years old or older. Diabetes Have regular diabetes screenings. This checks your fasting blood sugar level. Have the screening done: Once every three years after age 34 if you are at a normal weight and have a low risk for diabetes. More  often and at a younger age if you are overweight or have a high risk for diabetes. What should I know about preventing infection? Hepatitis B If you have a higher risk for hepatitis B, you should be screened for this virus. Talk with your health care provider to find out if you are at risk forhepatitis B infection. Hepatitis C Testing is recommended for: Everyone born from [birth year] through 1964. Anyone with known risk factors for hepatitis C. Sexually transmitted infections (STIs) Get screened for STIs, including gonorrhea and chlamydia, if: You are sexually active and are younger than 34 years of age. You are older than 34 years of age and  your health care provider tells you that you are at risk for this type of infection. Your sexual activity has changed since you were last screened, and you are at increased risk for chlamydia or gonorrhea. Ask your health care provider if you are at risk. Ask your health care provider about whether you are at high risk for HIV. Your health care provider may recommend a prescription medicine to help prevent HIV infection. If you choose to take medicine to prevent HIV, you should first get tested for HIV. You should then be tested every 3 months for as long as you are taking the medicine. Pregnancy If you are about to stop having your period (premenopausal) and you may become pregnant, seek counseling before you get pregnant. Take 400 to 800 micrograms (mcg) of folic acid every day if you become pregnant. Ask for birth control (contraception) if you want to prevent pregnancy. Osteoporosis and menopause Osteoporosis is a disease in which the bones lose minerals and strength with aging. This can result in bone fractures. If you are 61 years old or older, or if you are at risk for osteoporosis and fractures, ask your health care provider if you should: Be screened for bone loss. Take a calcium or vitamin D supplement to lower your risk of fractures. Be given hormone replacement therapy (HRT) to treat symptoms of menopause. Follow these instructions at home: Lifestyle Do not use any products that contain nicotine or tobacco, such as cigarettes, e-cigarettes, and chewing tobacco. If you need help quitting, ask your health care provider. Do not use street drugs. Do not share needles. Ask your health care provider for help if you need support or information about quitting drugs. Alcohol use Do not drink alcohol if: Your health care provider tells you not to drink. You are pregnant, may be pregnant, or are planning to become pregnant. If you drink alcohol: Limit how much you use to 0-1 drink a  day. Limit intake if you are breastfeeding. Be aware of how much alcohol is in your drink. In the [place] one drink equals one 12 oz bottle of beer (355 mL), one 5 oz glass of wine (148 mL), or one 1 oz glass of hard liquor (44 mL). General instructions Schedule regular health, dental, and eye exams. Stay current with your vaccines. Tell your health care provider if: You often feel depressed. You have ever been abused or do not feel safe at home. Summary Adopting a healthy lifestyle and getting preventive care are important in promoting health and wellness. Follow your health care provider's instructions about healthy diet, exercising, and getting tested or screened for diseases. Follow your health care provider's instructions on monitoring your cholesterol and blood pressure. This information is not intended to replace advice given to you by your health care provider. Make sure you discuss any questions  you have with your healthcare provider. Document Revised: 08/31/2018 Document Reviewed: 08/31/2018 Elsevier Patient Education  2022 [employer].

## 2021-05-02 LAB — CYTOLOGY - PAP
Chlamydia: NEGATIVE
Comment: NEGATIVE
Comment: NEGATIVE
Comment: NEGATIVE
Comment: NORMAL
Diagnosis: NEGATIVE
High risk HPV: NEGATIVE
Neisseria Gonorrhea: NEGATIVE
Trichomonas: NEGATIVE

## 2021-05-05 ENCOUNTER — Telehealth: Payer: Self-pay

## 2021-05-05 NOTE — Telephone Encounter (Signed)
Tried to call patient to remind to get her labs drawn for Dr. Ashley Royalty. Patient VM is not set up so cannot leave VM. Patient can go to any [facility] to get this done.

## 2021-05-30 ENCOUNTER — Ambulatory Visit: Payer: No Typology Code available for payment source | Admitting: Family Medicine

## 2021-06-06 ENCOUNTER — Other Ambulatory Visit: Payer: Self-pay | Admitting: Family Medicine

## 2021-06-06 ENCOUNTER — Ambulatory Visit: Payer: No Typology Code available for payment source | Admitting: Family Medicine

## 2021-06-06 DIAGNOSIS — R7989 Other specified abnormal findings of blood chemistry: Secondary | ICD-10-CM

## 2021-06-06 NOTE — Telephone Encounter (Signed)
Requested medication (s) are due for refill today:   Provider to review  Requested medication (s) are on the active medication list:   Yes  Future visit scheduled:   Yes   Last ordered: 04/15/2021 #8, 0 refills  Returned because it's a non delegated refill.   Requested Prescriptions  Pending Prescriptions Disp Refills   Vitamin D, Ergocalciferol, (DRISDOL) 1.25 MG (50000 UNIT) CAPS capsule [Pharmacy Med Name: VITAMIN D2 1.25MG (50,000 UNIT)] 4 capsule 1    Sig: Take 1 capsule (50,000 Units total) by mouth every 7 (seven) days. Take for 8 total doses(weeks)     Endocrinology:  Vitamins - Vitamin D Supplementation Failed - 06/06/2021  9:01 AM      Failed - 50,000 IU strengths are not delegated      Failed - Phosphate in normal range and within 360 days    No results found for: PHOS        Failed - Vitamin D in normal range and within 360 days    Vit D, 25-Hydroxy  Date Value Ref Range Status  04/14/2021 20.3 (L) 30.0 - 100.0 ng/mL Final    Comment:    Vitamin D deficiency has been defined by the Institute of Medicine and an Endocrine Society practice guideline as a level of serum 25-OH vitamin D less than 20 ng/mL (1,2). The Endocrine Society went on to further define vitamin D insufficiency as a level between 21 and 29 ng/mL (2). 1. IOM (Institute of Medicine). 2010. Dietary reference    intakes for calcium and D. [place]: The    [employer]. 2. Holick MF, Binkley China, Bischoff-Ferrari HA, et al.    Evaluation, treatment, and prevention of vitamin D    deficiency: an Endocrine Society clinical practice    guideline. JCEM. 2011 Jul; 96(7):1911-30.           Passed - Ca in normal range and within 360 days    Calcium  Date Value Ref Range Status  04/14/2021 9.0 8.7 - 10.2 mg/dL Final          Passed - Valid encounter within last 12 months    Recent Outpatient Visits           1 month ago Need for tetanus, diphtheria, and acellular pertussis (Tdap)  vaccine   [facility] Jerrol Banana, MD   1 month ago Awakens from sleep at night   [facility] Jerrol Banana, MD       Future Appointments             In 2 weeks Ashley Royalty, Ocie Bob, MD [facility]   In 10 months Ashley Royalty, Ocie Bob, MD [facility], [facility]

## 2021-06-24 ENCOUNTER — Ambulatory Visit: Payer: No Typology Code available for payment source | Admitting: Family Medicine

## 2021-10-17 ENCOUNTER — Telehealth: Payer: Self-pay

## 2021-10-17 NOTE — Telephone Encounter (Signed)
Patient needs a follow-up appointment sooner than her CPE 04/12/2022.  Please schedule.  Can order labs at appointment.

## 2021-10-17 NOTE — Telephone Encounter (Signed)
Left voice mail to set up app for follow up

## 2022-04-16 ENCOUNTER — Ambulatory Visit (INDEPENDENT_AMBULATORY_CARE_PROVIDER_SITE_OTHER): Payer: No Typology Code available for payment source | Admitting: Family Medicine

## 2022-04-16 ENCOUNTER — Encounter: Payer: Self-pay | Admitting: Family Medicine

## 2022-04-16 VITALS — BP 140/90 | HR 78 | Ht 67.0 in | Wt 236.2 lb

## 2022-04-16 DIAGNOSIS — E782 Mixed hyperlipidemia: Secondary | ICD-10-CM | POA: Diagnosis not present

## 2022-04-16 DIAGNOSIS — L309 Dermatitis, unspecified: Secondary | ICD-10-CM | POA: Insufficient documentation

## 2022-04-16 DIAGNOSIS — Z114 Encounter for screening for human immunodeficiency virus [HIV]: Secondary | ICD-10-CM

## 2022-04-16 DIAGNOSIS — R7301 Impaired fasting glucose: Secondary | ICD-10-CM

## 2022-04-16 DIAGNOSIS — Z0000 Encounter for general adult medical examination without abnormal findings: Secondary | ICD-10-CM | POA: Diagnosis not present

## 2022-04-16 DIAGNOSIS — R7989 Other specified abnormal findings of blood chemistry: Secondary | ICD-10-CM

## 2022-04-16 DIAGNOSIS — M799 Soft tissue disorder, unspecified: Secondary | ICD-10-CM

## 2022-04-18 LAB — COMPREHENSIVE METABOLIC PANEL
ALT: 13 IU/L (ref 0–32)
AST: 12 IU/L (ref 0–40)
Albumin/Globulin Ratio: 1.3 (ref 1.2–2.2)
Albumin: 4 g/dL (ref 3.9–4.9)
Alkaline Phosphatase: 53 IU/L (ref 44–121)
BUN/Creatinine Ratio: 9 (ref 9–23)
BUN: 8 mg/dL (ref 6–20)
Bilirubin Total: <0.2 mg/dL (ref 0.0–1.2)
CO2: 20 mmol/L (ref 20–29)
Calcium: 9.1 mg/dL (ref 8.7–10.2)
Chloride: 102 mmol/L (ref 96–106)
Creatinine, Ser: 0.9 mg/dL (ref 0.57–1.00)
Globulin, Total: 3 g/dL (ref 1.5–4.5)
Glucose: 94 mg/dL (ref 70–99)
Potassium: 4 mmol/L (ref 3.5–5.2)
Sodium: 138 mmol/L (ref 134–144)
Total Protein: 7 g/dL (ref 6.0–8.5)
eGFR: 85 mL/min/{1.73_m2} (ref >59)

## 2022-04-18 LAB — APO A1 + B + RATIO
Apolipo. B/A-1 Ratio: 0.7 ratio — ABNORMAL HIGH (ref 0.0–0.6)
Apolipoprotein A-1: 131 mg/dL (ref 116–209)
Apolipoprotein B: 86 mg/dL (ref <90)

## 2022-04-18 LAB — LIPID PANEL
Chol/HDL Ratio: 3.9 ratio (ref 0.0–4.4)
Cholesterol, Total: 139 mg/dL (ref 100–199)
HDL: 36 mg/dL — ABNORMAL LOW (ref >39)
LDL Chol Calc (NIH): 77 mg/dL (ref 0–99)
Triglycerides: 150 mg/dL — ABNORMAL HIGH (ref 0–149)
VLDL Cholesterol Cal: 26 mg/dL (ref 5–40)

## 2022-04-18 LAB — CBC
Hematocrit: 29.4 % — ABNORMAL LOW (ref 34.0–46.6)
Hemoglobin: 8.5 g/dL — ABNORMAL LOW (ref 11.1–15.9)
MCH: 19.2 pg — ABNORMAL LOW (ref 26.6–33.0)
MCHC: 28.9 g/dL — ABNORMAL LOW (ref 31.5–35.7)
MCV: 66 fL — ABNORMAL LOW (ref 79–97)
Platelets: 539 10*3/uL — ABNORMAL HIGH (ref 150–450)
RBC: 4.43 x10E6/uL (ref 3.77–5.28)
RDW: 17.6 % — ABNORMAL HIGH (ref 11.7–15.4)
WBC: 8.7 10*3/uL (ref 3.4–10.8)

## 2022-04-18 LAB — HEMOGLOBIN A1C
Est. average glucose Bld gHb Est-mCnc: 123 mg/dL
Hgb A1c MFr Bld: 5.9 % — ABNORMAL HIGH (ref 4.8–5.6)

## 2022-04-18 LAB — HIV ANTIBODY (ROUTINE TESTING W REFLEX): HIV Screen 4th Generation wRfx: NONREACTIVE

## 2022-04-18 LAB — VITAMIN D 25 HYDROXY (VIT D DEFICIENCY, FRACTURES): Vit D, 25-Hydroxy: 29.5 ng/mL — ABNORMAL LOW (ref 30.0–100.0)

## 2022-04-18 LAB — TSH: TSH: 0.95 u[IU]/mL (ref 0.450–4.500)

## 2022-04-22 ENCOUNTER — Other Ambulatory Visit: Payer: Self-pay | Admitting: Family Medicine

## 2022-04-22 DIAGNOSIS — R7989 Other specified abnormal findings of blood chemistry: Secondary | ICD-10-CM

## 2022-04-22 MED ORDER — VITAMIN D (ERGOCALCIFEROL) 1.25 MG (50000 UNIT) PO CAPS: 50000.0000 [IU] | ORAL_CAPSULE | ORAL | 0 refills | Status: DC

## 2022-04-22 NOTE — Progress Notes (Signed)
Annual Physical Exam Visit  Patient Information:  Patient ID: Jodi Conner, female DOB: 10-02-1985 Age: 36 y.o. MRN: [id number]   Subjective:   CC: Annual Physical Exam  HPI:  Jodi Conner is here for their annual physical.  I reviewed the past medical history, family history, social history, surgical history, and allergies today and changes were made as necessary.  Please see the problem list section below for additional details.  Past Medical History: Past Medical History:  Diagnosis Date   Allergy    GERD (gastroesophageal reflux disease)    Past Surgical History: History reviewed. No pertinent surgical history. Family History: Family History  Problem Relation Age of Onset   Alcohol abuse Mother    Cervical cancer Mother    Drug abuse Mother    Alcohol abuse Father    Cancer Maternal Grandmother    Allergies: No Known Allergies Health Maintenance: Health Maintenance  Topic Date Due   INFLUENZA VACCINE  04/21/2022   PAP SMEAR-Modifier  04/30/2024   TETANUS/TDAP  04/15/2031   Hepatitis C Screening  Completed   HIV Screening  Completed   HPV VACCINES  Aged Out   COVID-19 Vaccine  Discontinued    HM Colonoscopy     This patient has no relevant Health Maintenance data.      Medications: Current Outpatient Medications on File Prior to Visit  Medication Sig Dispense Refill   ASHWAGANDHA PO Take 2 capsules by mouth daily.     esomeprazole (NEXIUM) 20 MG capsule Take 20 mg by mouth daily at 12 noon.     fexofenadine (ALLEGRA) 180 MG tablet Take 180 mg by mouth daily.     Multiple Vitamins-Minerals (HAIR SKIN AND NAILS FORMULA PO) Take 2 capsules by mouth daily.     norgestimate-ethinyl estradiol (ORTHO-CYCLEN) 0.25-35 MG-MCG tablet Take 1 tablet by mouth daily. 90 tablet 3   No current facility-administered medications on file prior to visit.    Review of Systems: No headache, visual changes, nausea, vomiting, diarrhea, constipation, dizziness,  abdominal pain, +skin rash, fevers, chills, night sweats, swollen lymph nodes, weight loss, chest pain, body aches, joint swelling, muscle aches, shortness of breath, mood changes, visual or auditory hallucinations reported.  Objective:   Vitals:   04/16/22 0821  BP: 140/90  Pulse: 78  SpO2: 98%   Vitals:   04/16/22 0821  Weight: 236 lb 3.2 oz (107.1 kg)  Height: 5\' 7"  (1.702 m)   Body mass index is 36.99 kg/m.  General: Well Developed, well nourished, and in no acute distress.  Neuro: Alert and oriented x3, extra-ocular muscles intact, sensation grossly intact. Cranial nerves II through XII are grossly intact, motor, sensory, and coordinative functions are intact. HEENT: Normocephalic, atraumatic, pupils equal round reactive to light, neck supple, no masses, no lymphadenopathy, thyroid nonpalpable. Oropharynx, nasopharynx, external ear canals are unremarkable. Skin: Warm and dry, right foot medial midfoot skin lesion, question poor wound healing  Cardiac: Regular rate and rhythm, no murmurs rubs or gallops. No peripheral edema. Pulses symmetric. Respiratory: Clear to auscultation bilaterally. Not using accessory muscles, speaking in full sentences.  Abdominal: Soft, nontender, nondistended, positive bowel sounds, no masses, no organomegaly. Musculoskeletal: Shoulder, elbow, wrist, hip, knee, ankle stable, and with full range of motion.  Female chaperone initials: KH present throughout the physical examination.  Impression and Recommendations:   The patient was counselled, risk factors were discussed, and anticipatory guidance given.  Problem List Items Addressed This Visit       Other  Annual physical exam - Primary    Annual examination completed, risk stratification labs ordered, anticipatory guidance provided.  We will follow labs once resulted.      Relevant Orders   Apo A1 + B + Ratio (Completed)   CBC (Completed)   Comprehensive metabolic panel (Completed)    VITAMIN D 25 Hydroxy (Vit-D Deficiency, Fractures) (Completed)   TSH (Completed)   Lipid panel (Completed)   HIV Antibody (routine testing w rflx) (Completed)   Hemoglobin A1c (Completed)   Mixed hyperlipidemia   Relevant Orders   Apo A1 + B + Ratio (Completed)   Lipid panel (Completed)   Soft tissue lesion of foot    Poor wound healing noted, referral to podiatry placed.      Relevant Orders   Ambulatory referral to Podiatry   Elevated fasting glucose    Risk stratification labs ordered.      Relevant Orders   Hemoglobin A1c (Completed)   Other Visit Diagnoses     Low serum vitamin D       Relevant Orders   VITAMIN D 25 Hydroxy (Vit-D Deficiency, Fractures) (Completed)   Screening for HIV (human immunodeficiency virus)       Relevant Orders   HIV Antibody (routine testing w rflx) (Completed)        Orders & Medications Medications: No orders of the defined types were placed in this encounter.  Orders Placed This Encounter  Procedures   Apo A1 + B + Ratio   CBC   Comprehensive metabolic panel   VITAMIN D 25 Hydroxy (Vit-D Deficiency, Fractures)   TSH   Lipid panel   HIV Antibody (routine testing w rflx)   Hemoglobin A1c   Ambulatory referral to Podiatry     Return in about 3 months (around 07/17/2022).    Jerrol Banana, MD   Primary Care Sports Medicine [facility]

## 2022-04-22 NOTE — Assessment & Plan Note (Signed)
Annual examination completed, risk stratification labs ordered, anticipatory guidance provided.  We will follow labs once resulted. 

## 2022-04-22 NOTE — Assessment & Plan Note (Signed)
Risk stratification labs ordered 

## 2022-04-22 NOTE — Patient Instructions (Signed)
-   Obtain fasting labs with orders provided (can have water or black coffee but otherwise no food or drink x 8 hours before labs) - Review information provided - Attend eye doctor annually, dentist every 6 months, work towards or maintain 30 minutes of moderate intensity physical activity at least 5 days per week, and consume a balanced diet - Return in 3 months for follow-up - Contact us for any questions between now and then 

## 2022-04-22 NOTE — Assessment & Plan Note (Signed)
Poor wound healing noted, referral to podiatry placed.

## 2022-04-23 ENCOUNTER — Other Ambulatory Visit: Payer: Self-pay

## 2022-04-23 DIAGNOSIS — E611 Iron deficiency: Secondary | ICD-10-CM

## 2022-04-23 DIAGNOSIS — D649 Anemia, unspecified: Secondary | ICD-10-CM

## 2022-04-23 LAB — FERRITIN: Ferritin: 5 ng/mL — ABNORMAL LOW (ref 15–150)

## 2022-04-23 LAB — IRON AND TIBC
Iron Saturation: 4 % — CL (ref 15–55)
Iron: 19 ug/dL — ABNORMAL LOW (ref 27–159)
Total Iron Binding Capacity: 476 ug/dL — ABNORMAL HIGH (ref 250–450)
UIBC: 457 ug/dL — ABNORMAL HIGH (ref 131–425)

## 2022-04-23 LAB — SPECIMEN STATUS REPORT

## 2022-04-23 NOTE — Progress Notes (Signed)
Referral placed.

## 2022-04-23 NOTE — Progress Notes (Signed)
Pt viewed labs on Mychart.  KP 

## 2022-05-04 ENCOUNTER — Inpatient Hospital Stay: Payer: No Typology Code available for payment source

## 2022-05-04 ENCOUNTER — Encounter: Payer: Self-pay | Admitting: Oncology

## 2022-05-04 ENCOUNTER — Inpatient Hospital Stay: Payer: No Typology Code available for payment source | Attending: Oncology | Admitting: Oncology

## 2022-05-04 VITALS — BP 156/102 | HR 97 | Temp 99.1°F | Ht 68.0 in | Wt 237.0 lb

## 2022-05-04 DIAGNOSIS — D508 Other iron deficiency anemias: Secondary | ICD-10-CM

## 2022-05-04 DIAGNOSIS — D509 Iron deficiency anemia, unspecified: Secondary | ICD-10-CM | POA: Insufficient documentation

## 2022-05-04 DIAGNOSIS — R5382 Chronic fatigue, unspecified: Secondary | ICD-10-CM | POA: Insufficient documentation

## 2022-05-04 DIAGNOSIS — Z87891 Personal history of nicotine dependence: Secondary | ICD-10-CM | POA: Insufficient documentation

## 2022-05-04 LAB — CBC WITH DIFFERENTIAL/PLATELET
Abs Immature Granulocytes: 0.12 10*3/uL — ABNORMAL HIGH (ref 0.00–0.07)
Basophils Absolute: 0.1 10*3/uL (ref 0.0–0.1)
Basophils Relative: 1 %
Eosinophils Absolute: 0.2 10*3/uL (ref 0.0–0.5)
Eosinophils Relative: 2 %
HCT: 31.8 % — ABNORMAL LOW (ref 36.0–46.0)
Hemoglobin: 9.2 g/dL — ABNORMAL LOW (ref 12.0–15.0)
Immature Granulocytes: 1 %
Lymphocytes Relative: 23 %
Lymphs Abs: 2.5 10*3/uL (ref 0.7–4.0)
MCH: 20.3 pg — ABNORMAL LOW (ref 26.0–34.0)
MCHC: 28.9 g/dL — ABNORMAL LOW (ref 30.0–36.0)
MCV: 70.2 fL — ABNORMAL LOW (ref 80.0–100.0)
Monocytes Absolute: 0.5 10*3/uL (ref 0.1–1.0)
Monocytes Relative: 4 %
Neutro Abs: 7.4 10*3/uL (ref 1.7–7.7)
Neutrophils Relative %: 69 %
Platelets: 442 10*3/uL — ABNORMAL HIGH (ref 150–400)
RBC: 4.53 MIL/uL (ref 3.87–5.11)
RDW: 20.8 % — ABNORMAL HIGH (ref 11.5–15.5)
WBC: 10.8 10*3/uL — ABNORMAL HIGH (ref 4.0–10.5)
nRBC: 0 % (ref 0.0–0.2)

## 2022-05-04 LAB — IRON AND TIBC
Iron: 16 ug/dL — ABNORMAL LOW (ref 28–170)
Saturation Ratios: 3 % — ABNORMAL LOW (ref 10.4–31.8)
TIBC: 538 ug/dL — ABNORMAL HIGH (ref 250–450)
UIBC: 522 ug/dL

## 2022-05-04 LAB — FERRITIN: Ferritin: 5 ng/mL — ABNORMAL LOW (ref 11–307)

## 2022-05-04 LAB — RETIC PANEL
Immature Retic Fract: 29.3 % — ABNORMAL HIGH (ref 2.3–15.9)
RBC.: 4.51 MIL/uL (ref 3.87–5.11)
Retic Count, Absolute: 121.8 10*3/uL (ref 19.0–186.0)
Retic Ct Pct: 2.7 % (ref 0.4–3.1)
Reticulocyte Hemoglobin: 25 pg — ABNORMAL LOW (ref >27.9)

## 2022-05-04 NOTE — Assessment & Plan Note (Signed)
Previous blood work was reviewed and discussed with patient.  Consistent with severe iron deficiency anemia.  Patient prefers to have blood work rechecked since she has taken oral iron supplementation.  Will repeat CBC, iron TIBC ferritin I discussed about option of continue oral iron supplementation and repeat blood work for evaluation of treatment response.Alternative option of proceed with IV Venofer treatments. I discussed about the potential risks including but not limited to allergic reactions/infusion reactions including anaphylactic reactions, phlebitis, high blood pressure, wheezing, SOB, skin rash, weight gain, leg swelling, headache, nausea and fatigue, etc. plan IV Venofer weekly x5 Patient would like to consider after she sees repeat blood work today.  Today's blood work showed.-Hemoglobin has improved slightly.  Ferritin level is persistently decreased.

## 2022-05-04 NOTE — Progress Notes (Signed)
Hematology/Oncology Consult note [phone] [phone]      Patient Care Team: [name], MD as PCP - General (Family Medicine)   REFERRING PROVIDER: [name], MD  CHIEF COMPLAINTS/REASON FOR VISIT:  Anemia  ASSESSMENT & PLAN:   Iron deficiency anemia Previous blood work was reviewed and discussed with patient.  Consistent with severe iron deficiency anemia.  Patient prefers to have blood work rechecked since she has taken oral iron supplementation.  Will repeat CBC, iron TIBC ferritin I discussed about option of continue oral iron supplementation and repeat blood work for evaluation of treatment response.Alternative option of proceed with IV Venofer treatments. I discussed about the potential risks including but not limited to allergic reactions/infusion reactions including anaphylactic reactions, phlebitis, high blood pressure, wheezing, SOB, skin rash, weight gain, leg swelling, headache, nausea and fatigue, etc. plan IV Venofer weekly x5 Patient would like to consider after she sees repeat blood work today.  Today's blood work showed.-Hemoglobin has improved slightly.  Ferritin level is persistently decreased.   Orders Placed This Encounter  Procedures   CBC with Differential/Platelet    Standing Status:   Future    Number of Occurrences:   1    Standing Expiration Date:   05/05/2023   Retic Panel    Standing Status:   Future    Number of Occurrences:   1    Standing Expiration Date:   05/05/2023   Iron and TIBC    Standing Status:   Future    Number of Occurrences:   1    Standing Expiration Date:   05/05/2023   Ferritin    Standing Status:   Future    Number of Occurrences:   1    Standing Expiration Date:   11/04/2022    All questions were answered. The patient knows to call the clinic with any problems, questions or concerns.  Earlie Server, MD, PhD [facility] Hematology Oncology 05/04/2022     HISTORY OF PRESENTING  ILLNESS:  Jodi Conner is a  36 y.o.  female with PMH listed below who was referred to me for anemia Reviewed patient's recent labs that was done.  She was found to have abnormal CBC on 04/17/2022, hemoglobin 8.5, MCV 66, platelet 539,000, ferritin 5, iron saturation 4, TIBC 476. + Chronic fatigue.   + Patient has history of menorrhagia which improved after she was started on OCP.  Currently she feels menstrual flow is normal. She denies recent chest pain on exertion, shortness of breath on minimal exertion, pre-syncopal episodes, or palpitations She had not noticed any recent bleeding such as epistaxis, hematuria or hematochezia.  Occasionally she takes ibuprofen. She denies any pica and eats a variety of diet. Patient has started on oral iron supplementation once daily for about 1.5 weeks   MEDICAL HISTORY:  Past Medical History:  Diagnosis Date   Allergy    GERD (gastroesophageal reflux disease)     SURGICAL HISTORY: History reviewed. No pertinent surgical history.  SOCIAL HISTORY: Social History   Socioeconomic History   Marital status: Single    Spouse name: Not on file   Number of children: 0   Years of education: 16   Highest education level: Bachelor's degree (e.g., BA, AB, BS)  Occupational History   Occupation: Special educational needs teacher  Tobacco Use   Smoking status: Former    Types: Cigars   Smokeless tobacco: Never  Vaping Use   Vaping Use: Never used  Substance and Sexual  Activity   Alcohol use: Yes    Alcohol/week: 2.0 standard drinks of alcohol    Types: 2 Standard drinks or equivalent per week   Drug use: Never   Sexual activity: Yes    Birth control/protection: Condom, Pill  Other Topics Concern   Not on file  Social History Narrative   Not on file   Social Determinants of Health   Financial Resource Strain: Not on file  Food Insecurity: Not on file  Transportation Needs: Not on file  Physical Activity: Not on file  Stress: Not on file   Social Connections: Not on file  Intimate Partner Violence: Not on file    FAMILY HISTORY: Family History  Problem Relation Age of Onset   Alcohol abuse Mother    Cervical cancer Mother    Drug abuse Mother    Alcohol abuse Father    Cancer Maternal Grandmother     ALLERGIES:  has No Known Allergies.  MEDICATIONS:  Current Outpatient Medications  Medication Sig Dispense Refill   ASHWAGANDHA PO Take 2 capsules by mouth daily.     esomeprazole (NEXIUM) 20 MG capsule Take 20 mg by mouth daily at 12 noon.     fexofenadine (ALLEGRA) 180 MG tablet Take 180 mg by mouth daily.     MISC NATURAL PRODUCTS PO Take 1 Dose by mouth as directed. Nature iron otc 65 mg     Multiple Vitamins-Minerals (HAIR SKIN AND NAILS FORMULA PO) Take 2 capsules by mouth daily.     norgestimate-ethinyl estradiol (ORTHO-CYCLEN) 0.25-35 MG-MCG tablet Take 1 tablet by mouth daily. 90 tablet 3   Vitamin D, Ergocalciferol, (DRISDOL) 1.25 MG (50000 UNIT) CAPS capsule Take 1 capsule (50,000 Units total) by mouth every 7 (seven) days. Take for 8 total doses(weeks) 8 capsule 0   No current facility-administered medications for this visit.    Review of Systems  Constitutional:  Negative for appetite change, chills, fatigue and fever.  HENT:   Negative for hearing loss and voice change.   Eyes:  Negative for eye problems.  Respiratory:  Negative for chest tightness and cough.   Cardiovascular:  Negative for chest pain.  Gastrointestinal:  Negative for abdominal distention, abdominal pain and blood in stool.  Endocrine: Negative for hot flashes.  Genitourinary:  Negative for difficulty urinating and frequency.   Musculoskeletal:  Negative for arthralgias.  Skin:  Negative for itching and rash.  Neurological:  Negative for extremity weakness.  Hematological:  Negative for adenopathy.  Psychiatric/Behavioral:  Negative for confusion.     PHYSICAL EXAMINATION: ECOG PERFORMANCE STATUS: 0 - Asymptomatic Vitals:    05/04/22 1119  BP: (!) 156/102  Pulse: 97  Temp: 99.1 F (37.3 C)   Filed Weights   05/04/22 1119  Weight: 237 lb (107.5 kg)    Physical Exam Constitutional:      General: She is not in acute distress. HENT:     Head: Normocephalic and atraumatic.  Eyes:     General: No scleral icterus. Cardiovascular:     Rate and Rhythm: Normal rate and regular rhythm.     Heart sounds: Normal heart sounds.  Pulmonary:     Effort: Pulmonary effort is normal. No respiratory distress.     Breath sounds: No wheezing.  Abdominal:     General: Bowel sounds are normal. There is no distension.     Palpations: Abdomen is soft.  Musculoskeletal:        General: No deformity. Normal range of motion.  Cervical back: Normal range of motion and neck supple.  Skin:    General: Skin is warm and dry.     Findings: No erythema or rash.  Neurological:     Mental Status: She is alert and oriented to person, place, and time. Mental status is at baseline.     Cranial Nerves: No cranial nerve deficit.     Coordination: Coordination normal.  Psychiatric:        Mood and Affect: Mood normal.      LABORATORY DATA:  I have reviewed the data as listed    Latest Ref Rng & Units 05/04/2022   12:03 PM 04/17/2022   11:06 AM 04/14/2021    9:05 AM  CBC  WBC 4.0 - 10.5 K/uL 10.8  8.7  8.8   Hemoglobin 12.0 - 15.0 g/dL 9.2  8.5  84.6   Hematocrit 36.0 - 46.0 % 31.8  29.4  33.9   Platelets 150 - 400 K/uL 442  539  417       Latest Ref Rng & Units 04/17/2022   11:06 AM 04/14/2021    9:05 AM  CMP  Glucose 70 - 99 mg/dL 94  962   BUN 6 - 20 mg/dL 8  12   Creatinine 9.52 - 1.00 mg/dL 8.41  3.24   Sodium 401 - 144 mmol/L 138  140   Potassium 3.5 - 5.2 mmol/L 4.0  4.3   Chloride 96 - 106 mmol/L 102  104   CO2 20 - 29 mmol/L 20  23   Calcium 8.7 - 10.2 mg/dL 9.1  9.0   Total Protein 6.0 - 8.5 g/dL 7.0  6.9   Total Bilirubin 0.0 - 1.2 mg/dL <0.2  <7.2   Alkaline Phos 44 - 121 IU/L 53  67   AST 0 - 40 IU/L  12  14   ALT 0 - 32 IU/L 13  15    Lab Results  Component Value Date   IRON 16 (L) 05/04/2022   TIBC 538 (H) 05/04/2022   FERRITIN 5 (L) 05/04/2022      RADIOGRAPHIC STUDIES: I have personally reviewed the radiological images as listed and agreed with the findings in the report. No results found.

## 2022-05-05 ENCOUNTER — Other Ambulatory Visit: Payer: Self-pay

## 2022-05-05 ENCOUNTER — Telehealth: Payer: Self-pay

## 2022-05-05 NOTE — Telephone Encounter (Signed)
Called pt, no answer. Detailed VM left to ask about preference of taking oral iron vs IV iron. Mychart message sent. Will wait for response prior to scheduling.

## 2022-05-05 NOTE — Telephone Encounter (Signed)
-----   Message from Rickard Patience, MD sent at 05/04/2022  9:11 PM EDT ----- Please let patient know that reviewed blood work showed slightly improved hemoglobin however persistently decreased iron level.  She has option to continue oral iron supplementation versus IV Venofer If she agrees with IV Venofer treatments, I recommend IV Venofer weekly x5-please arrange. If she prefers to take oral iron supplementation, please send her prescription of slow Fe once daily with vitamin C 500 mcg daily, 90-day supply.  Her follow-up will be 3 months, lab prior to MD +/- Venofer.

## 2022-05-06 ENCOUNTER — Encounter: Payer: Self-pay | Admitting: Oncology

## 2022-05-06 NOTE — Telephone Encounter (Signed)
Patient replied to [employer]. She would like to proceed with IV venofer.   Please call patient to set up appts:   IV venofer weekly x5 - first dose is NEW Labs in 3 months  MD/ Venofer 1-2 days after labs.

## 2022-05-13 ENCOUNTER — Other Ambulatory Visit: Payer: Self-pay | Admitting: Advanced Practice Midwife

## 2022-05-13 DIAGNOSIS — Z01419 Encounter for gynecological examination (general) (routine) without abnormal findings: Secondary | ICD-10-CM

## 2022-05-13 DIAGNOSIS — Z30011 Encounter for initial prescription of contraceptive pills: Secondary | ICD-10-CM

## 2022-05-14 MED FILL — Iron Sucrose Inj 20 MG/ML (Fe Equiv): INTRAVENOUS | Qty: 10 | Status: AC

## 2022-05-15 ENCOUNTER — Inpatient Hospital Stay: Payer: No Typology Code available for payment source

## 2022-05-15 NOTE — Progress Notes (Signed)
Patient here for 1st time venofer. BP high x 2. Temp 100.5. Patient states that she thinks she has a sinus infection, and that her blood pressure is only high when she comes here. Per Clent Jacks, AP, no iron today. Have patient avoid salt and hydrate over the weekend. Patient rescheduled for 05/18/22 @ 3:30.

## 2022-05-18 ENCOUNTER — Inpatient Hospital Stay: Payer: No Typology Code available for payment source

## 2022-05-18 VITALS — BP 120/78 | HR 100 | Temp 99.6°F | Resp 20

## 2022-05-18 DIAGNOSIS — D509 Iron deficiency anemia, unspecified: Secondary | ICD-10-CM | POA: Diagnosis not present

## 2022-05-18 DIAGNOSIS — D508 Other iron deficiency anemias: Secondary | ICD-10-CM

## 2022-05-18 MED ORDER — SODIUM CHLORIDE 0.9 % IV SOLN
200.0000 mg | Freq: Once | INTRAVENOUS | Status: AC
  Administered 2022-05-18: 200 mg via INTRAVENOUS
  Filled 2022-05-18: qty 200

## 2022-05-18 MED ORDER — SODIUM CHLORIDE 0.9 % IV SOLN
Freq: Once | INTRAVENOUS | Status: AC
  Filled 2022-05-18: qty 250

## 2022-05-18 NOTE — Patient Instructions (Signed)

## 2022-05-22 ENCOUNTER — Telehealth: Payer: Self-pay | Admitting: Advanced Practice Midwife

## 2022-05-22 ENCOUNTER — Other Ambulatory Visit: Payer: Self-pay

## 2022-05-22 ENCOUNTER — Inpatient Hospital Stay: Payer: No Typology Code available for payment source | Attending: Oncology

## 2022-05-22 VITALS — BP 130/87 | HR 95 | Temp 98.3°F | Resp 18

## 2022-05-22 DIAGNOSIS — D509 Iron deficiency anemia, unspecified: Secondary | ICD-10-CM | POA: Insufficient documentation

## 2022-05-22 DIAGNOSIS — Z30011 Encounter for initial prescription of contraceptive pills: Secondary | ICD-10-CM

## 2022-05-22 DIAGNOSIS — D508 Other iron deficiency anemias: Secondary | ICD-10-CM

## 2022-05-22 DIAGNOSIS — Z01419 Encounter for gynecological examination (general) (routine) without abnormal findings: Secondary | ICD-10-CM

## 2022-05-22 MED ORDER — SODIUM CHLORIDE 0.9 % IV SOLN
200.0000 mg | Freq: Once | INTRAVENOUS | Status: AC
  Administered 2022-05-22: 200 mg via INTRAVENOUS
  Filled 2022-05-22: qty 200

## 2022-05-22 MED ORDER — NORGESTIMATE-ETH ESTRADIOL 0.25-35 MG-MCG PO TABS: 1.0000 | ORAL_TABLET | Freq: Every day | ORAL | 3 refills | Status: DC

## 2022-05-22 MED ORDER — SODIUM CHLORIDE 0.9 % IV SOLN
Freq: Once | INTRAVENOUS | Status: AC
  Filled 2022-05-22: qty 250

## 2022-05-22 NOTE — Telephone Encounter (Signed)
Patient is scheduled for annual exam with JEG for 07/15/22. Patient is requesting refill on birthcontrol to get  to her scheduled appointment. Please advise? [facility] [place]

## 2022-05-22 NOTE — Patient Instructions (Signed)

## 2022-05-22 NOTE — Telephone Encounter (Signed)
Refill sent.

## 2022-05-26 ENCOUNTER — Encounter: Payer: Self-pay | Admitting: Family Medicine

## 2022-05-26 NOTE — Telephone Encounter (Signed)
Please advise 

## 2022-05-29 ENCOUNTER — Inpatient Hospital Stay: Payer: No Typology Code available for payment source

## 2022-05-29 VITALS — BP 128/88 | HR 99 | Temp 98.1°F

## 2022-05-29 DIAGNOSIS — D509 Iron deficiency anemia, unspecified: Secondary | ICD-10-CM | POA: Diagnosis not present

## 2022-05-29 DIAGNOSIS — D508 Other iron deficiency anemias: Secondary | ICD-10-CM

## 2022-05-29 MED ORDER — SODIUM CHLORIDE 0.9 % IV SOLN
Freq: Once | INTRAVENOUS | Status: AC
  Filled 2022-05-29: qty 250

## 2022-05-29 MED ORDER — SODIUM CHLORIDE 0.9 % IV SOLN
200.0000 mg | Freq: Once | INTRAVENOUS | Status: AC
  Administered 2022-05-29: 200 mg via INTRAVENOUS
  Filled 2022-05-29: qty 200

## 2022-06-02 ENCOUNTER — Ambulatory Visit: Payer: No Typology Code available for payment source | Admitting: Podiatry

## 2022-06-02 ENCOUNTER — Ambulatory Visit: Payer: No Typology Code available for payment source | Admitting: Family Medicine

## 2022-06-05 ENCOUNTER — Inpatient Hospital Stay: Payer: No Typology Code available for payment source

## 2022-06-05 ENCOUNTER — Encounter: Payer: Self-pay | Admitting: Family Medicine

## 2022-06-05 ENCOUNTER — Ambulatory Visit: Payer: No Typology Code available for payment source | Admitting: Family Medicine

## 2022-06-05 VITALS — BP 148/90 | HR 82 | Ht 67.0 in | Wt 231.0 lb

## 2022-06-05 VITALS — BP 151/99 | HR 105 | Temp 99.5°F | Resp 18

## 2022-06-05 DIAGNOSIS — F419 Anxiety disorder, unspecified: Secondary | ICD-10-CM

## 2022-06-05 DIAGNOSIS — D508 Other iron deficiency anemias: Secondary | ICD-10-CM

## 2022-06-05 DIAGNOSIS — I10 Essential (primary) hypertension: Secondary | ICD-10-CM

## 2022-06-05 DIAGNOSIS — F32A Depression, unspecified: Secondary | ICD-10-CM | POA: Diagnosis not present

## 2022-06-05 DIAGNOSIS — D509 Iron deficiency anemia, unspecified: Secondary | ICD-10-CM | POA: Diagnosis not present

## 2022-06-05 MED ORDER — SODIUM CHLORIDE 0.9 % IV SOLN
Freq: Once | INTRAVENOUS | Status: AC
  Filled 2022-06-05: qty 250

## 2022-06-05 MED ORDER — HYDROCHLOROTHIAZIDE 12.5 MG PO TABS: 12.5000 mg | ORAL_TABLET | Freq: Every day | ORAL | 0 refills | Status: DC

## 2022-06-05 MED ORDER — SERTRALINE HCL 25 MG PO TABS: 25.0000 mg | ORAL_TABLET | Freq: Every day | ORAL | 0 refills | Status: DC

## 2022-06-05 MED ORDER — SODIUM CHLORIDE 0.9 % IV SOLN
200.0000 mg | Freq: Once | INTRAVENOUS | Status: AC
  Administered 2022-06-05: 200 mg via INTRAVENOUS
  Filled 2022-06-05: qty 200

## 2022-06-05 NOTE — Patient Instructions (Signed)
[facility]  Discharge Instructions: Thank you for choosing [facility] to provide your oncology and hematology care.  If you have a lab appointment with the Cancer Center, please go directly to the Cancer Center and check in at the registration area.  Wear comfortable clothing and clothing appropriate for easy access to any Portacath or PICC line.   We strive to give you quality time with your provider. You may need to reschedule your appointment if you arrive late (15 or more minutes).  Arriving late affects you and other patients whose appointments are after yours.  Also, if you miss three or more appointments without notifying the office, you may be dismissed from the clinic at the provider's discretion.      For prescription refill requests, have your pharmacy contact our office and allow 72 hours for refills to be completed.    Today you received the following chemotherapy and/or immunotherapy agents Iron Sucrose.      To help prevent nausea and vomiting after your treatment, we encourage you to take your nausea medication as directed.  BELOW ARE SYMPTOMS THAT SHOULD BE REPORTED IMMEDIATELY: *FEVER GREATER THAN 100.4 F (38 C) OR HIGHER *CHILLS OR SWEATING *NAUSEA AND VOMITING THAT IS NOT CONTROLLED WITH YOUR NAUSEA MEDICATION *UNUSUAL SHORTNESS OF BREATH *UNUSUAL BRUISING OR BLEEDING *URINARY PROBLEMS (pain or burning when urinating, or frequent urination) *BOWEL PROBLEMS (unusual diarrhea, constipation, pain near the anus) TENDERNESS IN MOUTH AND THROAT WITH OR WITHOUT PRESENCE OF ULCERS (sore throat, sores in mouth, or a toothache) UNUSUAL RASH, SWELLING OR PAIN  UNUSUAL VAGINAL DISCHARGE OR ITCHING   Items with * indicate a potential emergency and should be followed up as soon as possible or go to the Emergency Department if any problems should occur.  Please show the CHEMOTHERAPY ALERT CARD or IMMUNOTHERAPY ALERT CARD at check-in  to the Emergency Department and triage nurse.  Should you have questions after your visit or need to cancel or reschedule your appointment, please contact [facility]  [phone] and follow the prompts.  Office hours are 8:00 a.m. to 4:30 p.m. Monday - Friday. Please note that voicemails left after 4:00 p.m. may not be returned until the following business day.  We are closed weekends and major holidays. You have access to a nurse at all times for urgent questions. Please call the main number to the clinic [phone] and follow the prompts.  For any non-urgent questions, you may also contact your provider using MyChart. We now offer e-Visits for anyone 18 and older to request care online for non-urgent symptoms. For details visit [email or web address].   Also download the MyChart app! Go to the app store, search "MyChart", open the app, select [facility], and log in with your MyChart username and password.  Masks are optional in the cancer centers. If you would like for your care team to wear a mask while they are taking care of you, please let them know. For doctor visits, patients may have with them one support person who is at least 35 years old. At this time, visitors are not allowed in the infusion area.  Iron Sucrose Injection What is this medication? IRON SUCROSE (EYE ern SOO krose) treats low levels of iron (iron deficiency anemia) in people with kidney disease. Iron is a mineral that plays an important role in making red blood cells, which carry oxygen from your lungs to the rest of your body. This medicine may   be used for other purposes; ask your health care provider or pharmacist if you have questions. COMMON BRAND NAME(S): Venofer What should I tell my care team before I take this medication? They need to know if you have any of these conditions: Anemia not caused by low iron levels Heart disease High levels of iron in the blood Kidney  disease Liver disease An unusual or allergic reaction to iron, other medications, foods, dyes, or preservatives Pregnant or trying to get pregnant Breast-feeding How should I use this medication? This medication is for infusion into a vein. It is given in a hospital or clinic setting. Talk to your care team about the use of this medication in children. While this medication may be prescribed for children as young as 2 years for selected conditions, precautions do apply. Overdosage: If you think you have taken too much of this medicine contact a poison control center or emergency room at once. NOTE: This medicine is only for you. Do not share this medicine with others. What if I miss a dose? It is important not to miss your dose. Call your care team if you are unable to keep an appointment. What may interact with this medication? Do not take this medication with any of the following: Deferoxamine Dimercaprol Other iron products This medication may also interact with the following: Chloramphenicol Deferasirox This list may not describe all possible interactions. Give your health care provider a list of all the medicines, herbs, non-prescription drugs, or dietary supplements you use. Also tell them if you smoke, drink alcohol, or use illegal drugs. Some items may interact with your medicine. What should I watch for while using this medication? Visit your care team regularly. Tell your care team if your symptoms do not start to get better or if they get worse. You may need blood work done while you are taking this medication. You may need to follow a special diet. Talk to your care team. Foods that contain iron include: whole grains/cereals, dried fruits, beans, or peas, leafy green vegetables, and organ meats (liver, kidney). What side effects may I notice from receiving this medication? Side effects that you should report to your care team as soon as possible: Allergic reactions--skin rash,  itching, hives, swelling of the face, lips, tongue, or throat Low blood pressure--dizziness, feeling faint or lightheaded, blurry vision Shortness of breath Side effects that usually do not require medical attention (report to your care team if they continue or are bothersome): Flushing Headache Joint pain Muscle pain Nausea Pain, redness, or irritation at injection site This list may not describe all possible side effects. Call your doctor for medical advice about side effects. You may report side effects to FDA at 1-800-FDA-1088. Where should I keep my medication? This medication is given in a hospital or clinic and will not be stored at home. NOTE: This sheet is a summary. It may not cover all possible information. If you have questions about this medicine, talk to your doctor, pharmacist, or health care provider.  2023 Elsevier/Gold Standard (2007-10-29 00:00:00)   

## 2022-06-05 NOTE — Assessment & Plan Note (Signed)
Persistent symptomatology despite OTC Ashwagandha dosage, playing a definite role in most likely playing a role in her persistent and recalcitrant hypertension.  At this stage we discussed further pharmacological and nonpharmacologic treatment options, will initiate sertraline 25 mg daily, patient oriented information provided for adjunct treatments, will reassess symptoms in a little over 1 month at her scheduled follow-up.

## 2022-06-05 NOTE — Assessment & Plan Note (Signed)
Chronic issue, established with hematology, receiving iron infusions, subjectively reports improvements in fatigue following infusions.

## 2022-06-05 NOTE — Patient Instructions (Signed)
-   Start hydrochlorothiazide once daily - Start sertraline once daily - Review information provided and continue healthy lifestyle changes - Return for follow-up as scheduled

## 2022-06-05 NOTE — Assessment & Plan Note (Signed)
Persistent elevations noted on serial visits, she is asymptomatic from a cardiopulmonary standpoint and her examination is benign in that regard as well.  At this stage we will tackle hypertension with initiation of hydrochlorothiazide, will be addressing comorbid anxiety/depression, she has already begun healthy lifestyle changes with dietary sodium restrictions and increased exercise.  I have encouraged her to continue these healthy changes.  We will have her return for reevaluation in a little over a month.

## 2022-06-05 NOTE — Progress Notes (Signed)
     Primary Care / Sports Medicine Office Visit  Patient Information:  Patient ID: Jodi Conner, female DOB: [birth year] Age: 36 y.o. MRN: [id number]   Jodi Conner is a pleasant 36 y.o. female presenting with the following:  No chief complaint on file.   Vitals:   06/05/22 1018  BP: (!) 148/90  Pulse: 82  SpO2: 99%   Vitals:   06/05/22 1018  Weight: 231 lb (104.8 kg)  Height: 5\' 7"  (1.702 m)   Body mass index is 36.18 kg/m.  No results found.   Independent interpretation of notes and tests performed by another provider:   None  Procedures performed:   None  Pertinent History, Exam, Impression, and Recommendations:   Problem List Items Addressed This Visit       Cardiovascular and Mediastinum   Hypertension - Primary    Persistent elevations noted on serial visits, she is asymptomatic from a cardiopulmonary standpoint and her examination is benign in that regard as well.  At this stage we will tackle hypertension with initiation of hydrochlorothiazide, will be addressing comorbid anxiety/depression, she has already begun healthy lifestyle changes with dietary sodium restrictions and increased exercise.  I have encouraged her to continue these healthy changes.  We will have her return for reevaluation in a little over a month.      Relevant Medications   hydrochlorothiazide (HYDRODIURIL) 12.5 MG tablet     Other   Iron deficiency anemia (Chronic)    Chronic issue, established with hematology, receiving iron infusions, subjectively reports improvements in fatigue following infusions.      Anxiety and depression    Persistent symptomatology despite OTC Ashwagandha dosage, playing a definite role in most likely playing a role in her persistent and recalcitrant hypertension.  At this stage we discussed further pharmacological and nonpharmacologic treatment options, will initiate sertraline 25 mg daily, patient oriented information provided for adjunct treatments,  will reassess symptoms in a little over 1 month at her scheduled follow-up.      Relevant Medications   sertraline (ZOLOFT) 25 MG tablet     Orders & Medications Meds ordered this encounter  Medications   hydrochlorothiazide (HYDRODIURIL) 12.5 MG tablet    Sig: Take 1 tablet (12.5 mg total) by mouth daily.    Dispense:  60 tablet    Refill:  0   sertraline (ZOLOFT) 25 MG tablet    Sig: Take 1 tablet (25 mg total) by mouth daily.    Dispense:  60 tablet    Refill:  0   No orders of the defined types were placed in this encounter.    No follow-ups on file.     , MD   Primary Care Sports Medicine [facility]

## 2022-06-09 ENCOUNTER — Ambulatory Visit: Payer: No Typology Code available for payment source | Admitting: Podiatry

## 2022-06-09 DIAGNOSIS — L2089 Other atopic dermatitis: Secondary | ICD-10-CM

## 2022-06-09 DIAGNOSIS — B353 Tinea pedis: Secondary | ICD-10-CM

## 2022-06-09 MED ORDER — TERBINAFINE HCL 250 MG PO TABS: 250.0000 mg | ORAL_TABLET | Freq: Every day | ORAL | 0 refills | Status: AC

## 2022-06-09 MED ORDER — CLOTRIMAZOLE-BETAMETHASONE 1-0.05 % EX CREA: 1.0000 | TOPICAL_CREAM | Freq: Two times a day (BID) | CUTANEOUS | 0 refills | Status: DC

## 2022-06-12 ENCOUNTER — Inpatient Hospital Stay: Payer: No Typology Code available for payment source

## 2022-06-12 VITALS — BP 129/82 | HR 63 | Temp 99.1°F | Resp 18

## 2022-06-12 DIAGNOSIS — D508 Other iron deficiency anemias: Secondary | ICD-10-CM

## 2022-06-12 DIAGNOSIS — D509 Iron deficiency anemia, unspecified: Secondary | ICD-10-CM | POA: Diagnosis not present

## 2022-06-12 MED ORDER — SODIUM CHLORIDE 0.9 % IV SOLN
Freq: Once | INTRAVENOUS | Status: AC
  Filled 2022-06-12: qty 250

## 2022-06-12 MED ORDER — SODIUM CHLORIDE 0.9 % IV SOLN
200.0000 mg | Freq: Once | INTRAVENOUS | Status: AC
  Administered 2022-06-12: 200 mg via INTRAVENOUS
  Filled 2022-06-12: qty 200

## 2022-06-16 NOTE — Progress Notes (Signed)
Subjective:  Patient ID: Jodi Conner, female    DOB: Jan 12, 1986,  MRN: [id number]  Chief Complaint  Patient presents with   Foot Pain    Rt foot place on the side of foot that causes some discomfort when it gets dry     36 y.o. female presents with the above complaint.  Patient presents for complaint of right dorsal foot discoloration.  Patient states her primary doctor sent her over here for further evaluation management.  She states that there is some discomfort there is some itching associated with it.  She is tried some over-the-counter stuff none of which has helped.  She would like to discuss treatment options she has not seen anyone else prior to seeing me.  She states it causes discomfort when he gets dry.  No history of psoriasis or any dermatological manifestation anywhere else in the body   Review of Systems: Negative except as noted in the HPI. Denies N/V/F/Ch.  Past Medical History:  Diagnosis Date   Allergy    GERD (gastroesophageal reflux disease)     Current Outpatient Medications:    clotrimazole-betamethasone (LOTRISONE) cream, Apply 1 Application topically 2 (two) times daily., Disp: 30 g, Rfl: 0   terbinafine (LAMISIL) 250 MG tablet, Take 1 tablet (250 mg total) by mouth daily., Disp: 30 tablet, Rfl: 0   ASHWAGANDHA PO, Take 2 capsules by mouth daily., Disp: , Rfl:    esomeprazole (NEXIUM) 20 MG capsule, Take 20 mg by mouth daily at 12 noon., Disp: , Rfl:    fexofenadine (ALLEGRA) 180 MG tablet, Take 180 mg by mouth daily., Disp: , Rfl:    hydrochlorothiazide (HYDRODIURIL) 12.5 MG tablet, Take 1 tablet (12.5 mg total) by mouth daily., Disp: 60 tablet, Rfl: 0   MISC NATURAL PRODUCTS PO, Take 1 Dose by mouth as directed. Nature iron otc 65 mg, Disp: , Rfl:    mometasone (NASONEX) 50 MCG/ACT nasal spray, Place 2 sprays into the nose daily., Disp: , Rfl:    Multiple Vitamins-Minerals (HAIR SKIN AND NAILS FORMULA PO), Take 2 capsules by mouth daily., Disp: , Rfl:     norgestimate-ethinyl estradiol (ORTHO-CYCLEN) 0.25-35 MG-MCG tablet, Take 1 tablet by mouth daily., Disp: 90 tablet, Rfl: 3   sertraline (ZOLOFT) 25 MG tablet, Take 1 tablet (25 mg total) by mouth daily., Disp: 60 tablet, Rfl: 0   Vitamin D, Ergocalciferol, (DRISDOL) 1.25 MG (50000 UNIT) CAPS capsule, Take 1 capsule (50,000 Units total) by mouth every 7 (seven) days. Take for 8 total doses(weeks), Disp: 8 capsule, Rfl: 0  Social History   Tobacco Use  Smoking Status Former   Types: Cigars  Smokeless Tobacco Never    No Known Allergies Objective:  There were no vitals filed for this visit. There is no height or weight on file to calculate BMI. Constitutional Well developed. Well nourished.  Vascular Dorsalis pedis pulses palpable bilaterally. Posterior tibial pulses palpable bilaterally. Capillary refill normal to all digits.  No cyanosis or clubbing noted. Pedal hair growth normal.  Neurologic Normal speech. Oriented to person, place, and time. Epicritic sensation to light touch grossly present bilaterally.  Dermatologic Right atopic dermatitis versus athlete's foot subjective component of itching noted especially with xerosis present.  No concern for melanoma no noted.  No raised papule noted.  Orthopedic: Normal joint ROM without pain or crepitus bilaterally. No visible deformities. No bony tenderness.   Radiographs: None Assessment:   1. Tinea pedis of right foot   2. Flexural atopic dermatitis  Plan:  Patient was evaluated and treated and all questions answered.  Right atopic dermatitis versus athlete's foot -All questions and concerns were discussed with the patient in extensive detail given the severity of it in the setting of known liver history I believe patient would benefit from 30-day of Lamisil to be taken orally as well as Lotrisone cream to be applied twice a day.  I discussed with the patient she states understanding.  Both medications were sent to the  pharmacy  No follow-ups on file.

## 2022-07-15 ENCOUNTER — Encounter: Payer: Self-pay | Admitting: Advanced Practice Midwife

## 2022-07-15 ENCOUNTER — Ambulatory Visit (INDEPENDENT_AMBULATORY_CARE_PROVIDER_SITE_OTHER): Payer: No Typology Code available for payment source | Admitting: Advanced Practice Midwife

## 2022-07-15 DIAGNOSIS — Z3041 Encounter for surveillance of contraceptive pills: Secondary | ICD-10-CM

## 2022-07-15 DIAGNOSIS — Z01419 Encounter for gynecological examination (general) (routine) without abnormal findings: Secondary | ICD-10-CM | POA: Diagnosis not present

## 2022-07-15 MED ORDER — NORGESTIMATE-ETH ESTRADIOL 0.25-35 MG-MCG PO TABS: 1.0000 | ORAL_TABLET | Freq: Every day | ORAL | 3 refills | Status: DC

## 2022-07-15 NOTE — Progress Notes (Addendum)
Gynecology Annual Exam   PCP: Jerrol BananaMatthews, Jason J, MD  Chief Complaint:  Chief Complaint  Patient presents with   Annual Refill of Birth Control    History of Present Illness: Patient is a 36 y.o. G0P0000 presents for annual exam. The patient has no complaints today. She had an annual physical with PCP in July of this year. She has been under the care of PCP for anemia and lifestyle changes. She reports weight down 30 pounds since last year and she is feeling more energetic since starting iron infusions.  LMP: Patient's last menstrual period was 07/07/2022 (exact date). Average Interval: regular, 28 days Duration of flow:  5-7  days Heavy Menses: no Clots: no Intermenstrual Bleeding: no Postcoital Bleeding: no Dysmenorrhea: no  The patient is sexually active. She currently uses OCP (estrogen/progesterone) for contraception. She denies dyspareunia.  The patient does perform self breast exams.  There is no notable family history of breast or ovarian cancer in her family.  The patient wears seatbelts: yes.   The patient has regular exercise:  she is walking, stairs, yoga regularly; she admits healthy lifestyle diet and hydration; she usually has 5 hours of sleep due to difficulty staying asleep .    The patient denies current symptoms of depression.  She started zoloft about a month ago with good results.  Review of Systems: Review of Systems  Constitutional:  Negative for chills and fever.  HENT:  Negative for congestion, ear discharge, ear pain, hearing loss, sinus pain and sore throat.   Eyes:  Negative for blurred vision and double vision.  Respiratory:  Negative for cough, shortness of breath and wheezing.   Cardiovascular:  Negative for chest pain, palpitations and leg swelling.  Gastrointestinal:  Negative for abdominal pain, blood in stool, constipation, diarrhea, heartburn, melena, nausea and vomiting.  Genitourinary:  Negative for dysuria, flank pain, frequency, hematuria  and urgency.  Musculoskeletal:  Negative for back pain, joint pain and myalgias.  Skin:  Negative for itching and rash.  Neurological:  Negative for dizziness, tingling, tremors, sensory change, speech change, focal weakness, seizures, loss of consciousness, weakness and headaches.  Endo/Heme/Allergies:  Negative for environmental allergies. Does not bruise/bleed easily.  Psychiatric/Behavioral:  Negative for depression, hallucinations, memory loss, substance abuse and suicidal ideas. The patient is not nervous/anxious and does not have insomnia.        Positive for anxiety    Past Medical History:  Patient Active Problem List   Diagnosis Date Noted   Hypertension 06/05/2022   Iron deficiency anemia 05/04/2022   Mixed hyperlipidemia 04/16/2022   Soft tissue lesion of foot 04/16/2022   Elevated fasting glucose 04/16/2022   Annual physical exam 04/14/2021   Allergic rhinitis 04/14/2021   Anxiety and depression 04/08/2021   Awakens from sleep at night 04/08/2021    Past Surgical History:  History reviewed. No pertinent surgical history.  Gynecologic History:  Patient's last menstrual period was 07/07/2022 (exact date). Contraception: OCP (estrogen/progesterone) Last Pap: 2022 Results were: no abnormalities   Obstetric History: G0P0000  Family History:  Family History  Problem Relation Age of Onset   Alcohol abuse Mother    Cervical cancer Mother    Drug abuse Mother    Alcohol abuse Father    Cancer Maternal Grandmother     Social History:  Social History   Socioeconomic History   Marital status: Single    Spouse name: Not on file   Number of children: 0   Years of education:  16   Highest education level: Bachelor's degree (e.g., BA, AB, BS)  Occupational History   Occupation: Special educational needs teacher  Tobacco Use   Smoking status: Former    Types: Cigars   Smokeless tobacco: Never  Vaping Use   Vaping Use: Never used  Substance and Sexual Activity    Alcohol use: Yes    Alcohol/week: 2.0 standard drinks of alcohol    Types: 2 Standard drinks or equivalent per week   Drug use: Never   Sexual activity: Yes    Birth control/protection: Condom, Pill  Other Topics Concern   Not on file  Social History Narrative   Not on file   Social Determinants of Health   Financial Resource Strain: Not on file  Food Insecurity: Not on file  Transportation Needs: Not on file  Physical Activity: Not on file  Stress: Not on file  Social Connections: Not on file  Intimate Partner Violence: Not on file    Allergies:  No Known Allergies  Medications: Prior to Admission medications   Medication Sig Start Date End Date Taking? Authorizing Provider  ASHWAGANDHA PO Take 2 capsules by mouth daily.   Yes [provider]  clotrimazole-betamethasone (LOTRISONE) cream Apply 1 Application topically 2 (two) times daily. 06/09/22  Yes Felipa Furnace, DPM  esomeprazole (NEXIUM) 20 MG capsule Take 20 mg by mouth daily at 12 noon.   Yes [provider]  fexofenadine (ALLEGRA) 180 MG tablet Take 180 mg by mouth daily.   Yes [provider]  hydrochlorothiazide (HYDRODIURIL) 12.5 MG tablet Take 1 tablet (12.5 mg total) by mouth daily. 06/05/22  Yes [name], MD  MISC NATURAL PRODUCTS PO Take 1 Dose by mouth as directed. Nature iron otc 65 mg   Yes [provider]  mometasone (NASONEX) 50 MCG/ACT nasal spray Place 2 sprays into the nose daily.   Yes [provider]  Multiple Vitamins-Minerals (HAIR SKIN AND NAILS FORMULA PO) Take 2 capsules by mouth daily.   Yes [provider]  sertraline (ZOLOFT) 25 MG tablet Take 1 tablet (25 mg total) by mouth daily. 06/05/22  Yes [name], MD  Vitamin D, Ergocalciferol, (DRISDOL) 1.25 MG (50000 UNIT) CAPS capsule Take 1 capsule (50,000 Units total) by mouth every 7 (seven) days. Take for 8 total doses(weeks) 04/22/22  Yes [name], MD   norgestimate-ethinyl estradiol (ORTHO-CYCLEN) 0.25-35 MG-MCG tablet Take 1 tablet by mouth daily. 07/15/22   Rod Can, CNM    Physical Exam Vitals: Blood pressure 128/88, height 5\' 7"  (1.702 m), weight 221 lb (100.2 kg), last menstrual period 07/07/2022.  General: NAD HEENT: normocephalic, anicteric Thyroid: no enlargement, no palpable nodules Pulmonary: No increased work of breathing, CTAB Cardiovascular: RRR, distal pulses 2+ Breast: Breast symmetrical, no tenderness, no palpable nodules or masses, no skin or nipple retraction present, no nipple discharge.  No axillary or supraclavicular lymphadenopathy. Abdomen: NABS, soft, non-tender, non-distended.  Umbilicus without lesions.  No hepatomegaly, splenomegaly or masses palpable. No evidence of hernia  Genitourinary: deferred for no concerns/PAP interval Extremities: no edema, erythema, or tenderness Neurologic: Grossly intact Psychiatric: mood appropriate, affect full   POCT Hgb today: 11.5  Assessment: 36 y.o. G0P0000 routine annual exam  Plan: Problem List Items Addressed This Visit   None Visit Diagnoses     Encounter for surveillance of contraceptive pills       Relevant Medications   norgestimate-ethinyl estradiol (ORTHO-CYCLEN) 0.25-35 MG-MCG tablet       1) STI  screening  was offered and declined  2)  ASCCP guidelines and rationale discussed.  Patient opts for every 3 years screening interval  3) Contraception - the patient is currently using  OCP (estrogen/progesterone).  She is happy with her current form of contraception and plans to continue  4) Routine healthcare maintenance including cholesterol, diabetes screening discussed managed by PCP  5) Return in about 1 year (around 07/16/2023) for exam for refill of birth control.   Rod Can, [facility] 07/15/2022, 9:12 AM

## 2022-07-17 ENCOUNTER — Encounter: Payer: Self-pay | Admitting: Family Medicine

## 2022-07-17 ENCOUNTER — Ambulatory Visit: Payer: No Typology Code available for payment source | Admitting: Family Medicine

## 2022-07-17 VITALS — BP 138/98 | HR 90 | Ht 67.0 in | Wt 220.0 lb

## 2022-07-17 DIAGNOSIS — I10 Essential (primary) hypertension: Secondary | ICD-10-CM

## 2022-07-17 DIAGNOSIS — E782 Mixed hyperlipidemia: Secondary | ICD-10-CM

## 2022-07-17 DIAGNOSIS — F419 Anxiety disorder, unspecified: Secondary | ICD-10-CM

## 2022-07-17 DIAGNOSIS — F32A Depression, unspecified: Secondary | ICD-10-CM

## 2022-07-17 DIAGNOSIS — G478 Other sleep disorders: Secondary | ICD-10-CM | POA: Diagnosis not present

## 2022-07-17 DIAGNOSIS — R7303 Prediabetes: Secondary | ICD-10-CM | POA: Insufficient documentation

## 2022-07-17 DIAGNOSIS — Z7984 Long term (current) use of oral hypoglycemic drugs: Secondary | ICD-10-CM | POA: Insufficient documentation

## 2022-07-17 MED ORDER — HYDROCHLOROTHIAZIDE 25 MG PO TABS: 25.0000 mg | ORAL_TABLET | Freq: Every day | ORAL | 0 refills | Status: DC

## 2022-07-17 NOTE — Patient Instructions (Addendum)
-   Take new dose of hydrochlorothiazide (25 mg) daily - Continue sertraline 25 mg daily (can adjust dosing time at your discretion) - Referral coordinator will contact you to schedule virtual visit with nutritionist - Contact ENT/sleep medicine for home sleep study results and next steps - Continue healthy lifestyle changes as you have been - Return for an office visit in 3 months, contact for any questions/concerns between now and then

## 2022-07-17 NOTE — Assessment & Plan Note (Signed)
Referral to nutritionist placed, patient is already engaged in healthy lifestyle changes, plan to recheck A1c in 3 months.

## 2022-07-17 NOTE — Assessment & Plan Note (Signed)
Denies any cardiopulmonary complaints and her examination in that regard is benign.  Variable BP readings noted per chart review, overall improvement, for tighter control we will titrate hydrochlorothiazide to 25 mg daily.

## 2022-07-17 NOTE — Assessment & Plan Note (Signed)
Patient had home sleep study, has established with ENT/sleep medicine, we have reached out to their office for records, I encouraged patient to touch base with their group as well for results and next steps.

## 2022-07-17 NOTE — Progress Notes (Signed)
     Primary Care / Sports Medicine Office Visit  Patient Information:  Patient ID: Jodi Conner, female DOB: 26-Mar-1986 Age: 36 y.o. MRN: [id number]   Jodi Conner is a pleasant 35 y.o. female presenting with the following:  Chief Complaint  Patient presents with   Depression    Medicaiton is effecting  sleep   Hypertension    Bp is running normal range at home    Vitals:   07/17/22 0801 07/17/22 0802  BP: (!) 132/98 (!) 138/98  Pulse: 90   SpO2: 99%    Vitals:   07/17/22 0801  Weight: 220 lb (99.8 kg)  Height: 5\' 7"  (1.702 m)   Body mass index is 34.46 kg/m.  No results found.   Independent interpretation of notes and tests performed by another provider:   None  Procedures performed:   None  Pertinent History, Exam, Impression, and Recommendations:   Problem List Items Addressed This Visit       Cardiovascular and Mediastinum   Hypertension - Primary    Denies any cardiopulmonary complaints and her examination in that regard is benign.  Variable BP readings noted per chart review, overall improvement, for tighter control we will titrate hydrochlorothiazide to 25 mg daily.      Relevant Medications   hydrochlorothiazide (HYDRODIURIL) 25 MG tablet   Other Relevant Orders   Referral to Nutrition and Diabetes Services     Other   Anxiety and depression    Patient has noted improvements in mood following initiation of sertraline however some dizziness reported, patient is currently working on dosing time throughout the day to mitigate this.  This is somewhat, unfounded by comorbid obstructive sleep apnea concern resulting in nighttime awakening.  For now we will continue current sertraline 25 mg dose after discussion on possible modification, and have patient pursue OSA evaluation. See additional assessment(s) for plan details.      Awakens from sleep at night    Patient had home sleep study, has established with ENT/sleep medicine, we have reached out  to their office for records, I encouraged patient to touch base with their group as well for results and next steps.      Mixed hyperlipidemia   Relevant Medications   hydrochlorothiazide (HYDRODIURIL) 25 MG tablet   Other Relevant Orders   Referral to Nutrition and Diabetes Services   Prediabetes    Referral to nutritionist placed, patient is already engaged in healthy lifestyle changes, plan to recheck A1c in 3 months.      Relevant Orders   Referral to Nutrition and Diabetes Services     Orders & Medications Meds ordered this encounter  Medications   hydrochlorothiazide (HYDRODIURIL) 25 MG tablet    Sig: Take 1 tablet (25 mg total) by mouth daily.    Dispense:  90 tablet    Refill:  0   Orders Placed This Encounter  Procedures   Referral to Nutrition and Diabetes Services     Return in about 3 months (around 10/17/2022) for BP follow-up, A1c recheck.     [name], MD   Primary Care Sports Medicine [place]

## 2022-07-17 NOTE — Assessment & Plan Note (Signed)
>>  ASSESSMENT AND PLAN FOR PREDIABETES WRITTEN ON 07/17/2022  8:39 AM BY Kevontae Burgoon J, MD  Referral to nutritionist placed, patient is already engaged in healthy lifestyle changes, plan to recheck A1c in 3 months.

## 2022-07-17 NOTE — Assessment & Plan Note (Signed)
Patient has noted improvements in mood following initiation of sertraline however some dizziness reported, patient is currently working on dosing time throughout the day to mitigate this.  This is somewhat, unfounded by comorbid obstructive sleep apnea concern resulting in nighttime awakening.  For now we will continue current sertraline 25 mg dose after discussion on possible modification, and have patient pursue OSA evaluation. See additional assessment(s) for plan details.

## 2022-07-28 ENCOUNTER — Ambulatory Visit: Payer: No Typology Code available for payment source | Admitting: Podiatry

## 2022-07-28 DIAGNOSIS — B353 Tinea pedis: Secondary | ICD-10-CM

## 2022-07-28 DIAGNOSIS — L2089 Other atopic dermatitis: Secondary | ICD-10-CM

## 2022-07-30 ENCOUNTER — Other Ambulatory Visit: Payer: Self-pay | Admitting: Family Medicine

## 2022-07-30 DIAGNOSIS — F32A Depression, unspecified: Secondary | ICD-10-CM

## 2022-07-30 MED ORDER — CLOTRIMAZOLE-BETAMETHASONE 1-0.05 % EX CREA: 1.0000 | TOPICAL_CREAM | Freq: Two times a day (BID) | CUTANEOUS | 0 refills | Status: DC

## 2022-07-30 NOTE — Progress Notes (Signed)
  Subjective:  Patient ID: Jodi Conner, female    DOB: 1986/03/17,  MRN: [id number]  Chief Complaint  Patient presents with   Tinea Pedis    36 y.o. female presents with the above complaint.  Patient presents with follow-up of right dorsal foot discoloration/dermatitis.  She states is doing a lot better the medication has helped considerably.   Review of Systems: Negative except as noted in the HPI. Denies N/V/F/Ch.  Past Medical History:  Diagnosis Date   Allergy    GERD (gastroesophageal reflux disease)     Current Outpatient Medications:    ASHWAGANDHA PO, Take 2 capsules by mouth daily., Disp: , Rfl:    clotrimazole-betamethasone (LOTRISONE) cream, Apply 1 Application topically 2 (two) times daily., Disp: 30 g, Rfl: 0   esomeprazole (NEXIUM) 20 MG capsule, Take 20 mg by mouth daily at 12 noon., Disp: , Rfl:    ferrous sulfate 325 (65 FE) MG tablet, Take 325 mg by mouth every other day., Disp: , Rfl:    fexofenadine (ALLEGRA) 180 MG tablet, Take 180 mg by mouth daily., Disp: , Rfl:    hydrochlorothiazide (HYDRODIURIL) 25 MG tablet, Take 1 tablet (25 mg total) by mouth daily., Disp: 90 tablet, Rfl: 0   MISC NATURAL PRODUCTS PO, Take 1 Dose by mouth as directed. Nature iron otc 65 mg, Disp: , Rfl:    mometasone (NASONEX) 50 MCG/ACT nasal spray, Place 2 sprays into the nose daily., Disp: , Rfl:    Multiple Vitamins-Minerals (HAIR SKIN AND NAILS FORMULA PO), Take 2 capsules by mouth daily., Disp: , Rfl:    norgestimate-ethinyl estradiol (ORTHO-CYCLEN) 0.25-35 MG-MCG tablet, Take 1 tablet by mouth daily., Disp: 90 tablet, Rfl: 3   sertraline (ZOLOFT) 25 MG tablet, TAKE 1 TABLET (25 MG TOTAL) BY MOUTH DAILY., Disp: 90 tablet, Rfl: 0  Social History   Tobacco Use  Smoking Status Former   Types: Cigars  Smokeless Tobacco Never    No Known Allergies Objective:  There were no vitals filed for this visit. There is no height or weight on file to calculate BMI. Constitutional  Well developed. Well nourished.  Vascular Dorsalis pedis pulses palpable bilaterally. Posterior tibial pulses palpable bilaterally. Capillary refill normal to all digits.  No cyanosis or clubbing noted. Pedal hair growth normal.  Neurologic Normal speech. Oriented to person, place, and time. Epicritic sensation to light touch grossly present bilaterally.  Dermatologic Right atopic dermatitis/athlete's foot has clinically resolved.  No further itching noted no other signs of dermatitis noted.  Orthopedic: Normal joint ROM without pain or crepitus bilaterally. No visible deformities. No bony tenderness.   Radiographs: None Assessment:   No diagnosis found.  Plan:  Patient was evaluated and treated and all questions answered.  Right atopic dermatitis versus athlete's foot -Clinically healed and has improved considerably.  At this time I discussed prevention technique guided and instructed her that she can continue using Lotrisone cream.  As needed.  She states understanding will do so.  I will send in for the Lotrisone cream as needed  No follow-ups on file.

## 2022-08-05 ENCOUNTER — Inpatient Hospital Stay: Payer: No Typology Code available for payment source | Attending: Oncology

## 2022-08-05 DIAGNOSIS — D509 Iron deficiency anemia, unspecified: Secondary | ICD-10-CM | POA: Insufficient documentation

## 2022-08-05 DIAGNOSIS — D508 Other iron deficiency anemias: Secondary | ICD-10-CM

## 2022-08-05 DIAGNOSIS — Z79899 Other long term (current) drug therapy: Secondary | ICD-10-CM | POA: Diagnosis not present

## 2022-08-05 LAB — CBC WITH DIFFERENTIAL/PLATELET
Abs Immature Granulocytes: 0.03 10*3/uL (ref 0.00–0.07)
Basophils Absolute: 0 10*3/uL (ref 0.0–0.1)
Basophils Relative: 0 %
Eosinophils Absolute: 0.2 10*3/uL (ref 0.0–0.5)
Eosinophils Relative: 2 %
HCT: 40 % (ref 36.0–46.0)
Hemoglobin: 13.6 g/dL (ref 12.0–15.0)
Immature Granulocytes: 0 %
Lymphocytes Relative: 21 %
Lymphs Abs: 2.2 10*3/uL (ref 0.7–4.0)
MCH: 28.4 pg (ref 26.0–34.0)
MCHC: 34 g/dL (ref 30.0–36.0)
MCV: 83.5 fL (ref 80.0–100.0)
Monocytes Absolute: 0.4 10*3/uL (ref 0.1–1.0)
Monocytes Relative: 4 %
Neutro Abs: 7.7 10*3/uL (ref 1.7–7.7)
Neutrophils Relative %: 73 %
Platelets: 381 10*3/uL (ref 150–400)
RBC: 4.79 MIL/uL (ref 3.87–5.11)
RDW: 16.7 % — ABNORMAL HIGH (ref 11.5–15.5)
WBC: 10.6 10*3/uL — ABNORMAL HIGH (ref 4.0–10.5)
nRBC: 0 % (ref 0.0–0.2)

## 2022-08-05 LAB — IRON AND TIBC
Iron: 58 ug/dL (ref 28–170)
Saturation Ratios: 14 % (ref 10.4–31.8)
TIBC: 428 ug/dL (ref 250–450)
UIBC: 370 ug/dL

## 2022-08-05 LAB — FERRITIN: Ferritin: 54 ng/mL (ref 11–307)

## 2022-08-06 MED FILL — Iron Sucrose Inj 20 MG/ML (Fe Equiv): INTRAVENOUS | Qty: 10 | Status: AC

## 2022-08-07 ENCOUNTER — Inpatient Hospital Stay (HOSPITAL_BASED_OUTPATIENT_CLINIC_OR_DEPARTMENT_OTHER): Payer: No Typology Code available for payment source | Admitting: Oncology

## 2022-08-07 ENCOUNTER — Inpatient Hospital Stay: Payer: No Typology Code available for payment source

## 2022-08-07 ENCOUNTER — Encounter: Payer: Self-pay | Admitting: Oncology

## 2022-08-07 VITALS — BP 149/92 | HR 82 | Temp 99.2°F | Wt 224.1 lb

## 2022-08-07 DIAGNOSIS — D508 Other iron deficiency anemias: Secondary | ICD-10-CM

## 2022-08-07 DIAGNOSIS — D509 Iron deficiency anemia, unspecified: Secondary | ICD-10-CM | POA: Diagnosis not present

## 2022-08-08 ENCOUNTER — Encounter: Payer: Self-pay | Admitting: Oncology

## 2022-08-08 NOTE — Progress Notes (Signed)
Hematology/Oncology Consult note [phone] [phone]      Patient Care Team: Jodi Banana, MD as PCP - General (Family Medicine)   REFERRING PROVIDER: Jerrol Banana, MD  CHIEF COMPLAINTS/REASON FOR VISIT:  Anemia  ASSESSMENT & PLAN:   Iron deficiency anemia Labs are reviewed and discussed with patient. Hold off IV venofer.   No orders of the defined types were placed in this encounter. All questions were answered. The patient knows to call the clinic with any problems, questions or concerns. Follow up PRN  Jodi Patience, MD, PhD [facility] Hematology Oncology 08/07/2022     HISTORY OF PRESENTING ILLNESS:  Jodi Conner is a  36 y.o.  female with PMH listed below who was referred to me for anemia Reviewed patient's recent labs that was done.  She was found to have abnormal CBC on 04/17/2022, hemoglobin 8.5, MCV 66, platelet 539,000, ferritin 5, iron saturation 4, TIBC 476. + Chronic fatigue.   + Patient has history of menorrhagia which improved after she was started on OCP.  Currently she feels menstrual flow is normal. She denies recent chest pain on exertion, shortness of breath on minimal exertion, pre-syncopal episodes, or palpitations She had not noticed any recent bleeding such as epistaxis, hematuria or hematochezia.  Occasionally she takes ibuprofen. She denies any pica and eats a variety of diet. Patient has started on oral iron supplementation once daily for about 1.5 weeks  INTERVAL HISTORY Jodi Conner is a 36 y.o. female who has above history reviewed by me today presents for follow up visit for  Iron deficiency anemia.  S/p IV venofer, she tolerates well. Fatigue has improved.   MEDICAL HISTORY:  Past Medical History:  Diagnosis Date   Allergy    GERD (gastroesophageal reflux disease)     SURGICAL HISTORY: History reviewed. No pertinent surgical history.  SOCIAL HISTORY: Social History   Socioeconomic History    Marital status: Single    Spouse name: Not on file   Number of children: 0   Years of education: 16   Highest education level: Bachelor's degree (e.g., BA, AB, BS)  Occupational History   Occupation: Network engineer  Tobacco Use   Smoking status: Former    Types: Cigars   Smokeless tobacco: Never  Vaping Use   Vaping Use: Never used  Substance and Sexual Activity   Alcohol use: Yes    Alcohol/week: 2.0 standard drinks of alcohol    Types: 2 Standard drinks or equivalent per week   Drug use: Never   Sexual activity: Yes    Birth control/protection: Condom, Pill  Other Topics Concern   Not on file  Social History Narrative   Not on file   Social Determinants of Health   Financial Resource Strain: Not on file  Food Insecurity: No Food Insecurity (07/17/2022)   Hunger Vital Sign    Worried About Running Out of Food in the Last Year: Never true    Ran Out of Food in the Last Year: Never true  Transportation Needs: No Transportation Needs (07/17/2022)   PRAPARE - Administrator, Civil Service (Medical): No    Lack of Transportation (Non-Medical): No  Physical Activity: Not on file  Stress: Not on file  Social Connections: Not on file  Intimate Partner Violence: Not At Risk (07/17/2022)   Humiliation, Afraid, Rape, and Kick questionnaire    Fear of Current or Ex-Partner: No    Emotionally Abused: No    Physically  Abused: No    Sexually Abused: No    FAMILY HISTORY: Family History  Problem Relation Age of Onset   Alcohol abuse Mother    Cervical cancer Mother    Drug abuse Mother    Alcohol abuse Father    Cancer Maternal Grandmother     ALLERGIES:  has No Known Allergies.  MEDICATIONS:  Current Outpatient Medications  Medication Sig Dispense Refill   ASHWAGANDHA PO Take 2 capsules by mouth daily.     clotrimazole-betamethasone (LOTRISONE) cream Apply 1 Application topically 2 (two) times daily. 30 g 0   esomeprazole (NEXIUM) 20 MG  capsule Take 20 mg by mouth daily at 12 noon.     ferrous sulfate 325 (65 FE) MG tablet Take 325 mg by mouth every other day.     fexofenadine (ALLEGRA) 180 MG tablet Take 180 mg by mouth daily.     hydrochlorothiazide (HYDRODIURIL) 25 MG tablet Take 1 tablet (25 mg total) by mouth daily. 90 tablet 0   MISC NATURAL PRODUCTS PO Take 1 Dose by mouth as directed. Nature iron otc 65 mg     mometasone (NASONEX) 50 MCG/ACT nasal spray Place 2 sprays into the nose daily.     Multiple Vitamins-Minerals (HAIR SKIN AND NAILS FORMULA PO) Take 2 capsules by mouth daily.     norgestimate-ethinyl estradiol (ORTHO-CYCLEN) 0.25-35 MG-MCG tablet Take 1 tablet by mouth daily. 90 tablet 3   sertraline (ZOLOFT) 25 MG tablet TAKE 1 TABLET (25 MG TOTAL) BY MOUTH DAILY. 90 tablet 0   No current facility-administered medications for this visit.    Review of Systems  Constitutional:  Negative for appetite change, chills, fatigue and fever.  HENT:   Negative for hearing loss and voice change.   Eyes:  Negative for eye problems.  Respiratory:  Negative for chest tightness and cough.   Cardiovascular:  Negative for chest pain.  Gastrointestinal:  Negative for abdominal distention, abdominal pain and blood in stool.  Endocrine: Negative for hot flashes.  Genitourinary:  Negative for difficulty urinating and frequency.   Musculoskeletal:  Negative for arthralgias.  Skin:  Negative for itching and rash.  Neurological:  Negative for extremity weakness.  Hematological:  Negative for adenopathy.  Psychiatric/Behavioral:  Negative for confusion.     PHYSICAL EXAMINATION: ECOG PERFORMANCE STATUS: 0 - Asymptomatic Vitals:   08/07/22 1216  BP: (!) 149/92  Pulse: 82  Temp: 99.2 F (37.3 C)  SpO2: 100%   Filed Weights   08/07/22 1216  Weight: 224 lb 1.6 oz (101.7 kg)    Physical Exam Constitutional:      General: She is not in acute distress. HENT:     Head: Normocephalic and atraumatic.  Eyes:      General: No scleral icterus. Cardiovascular:     Rate and Rhythm: Normal rate and regular rhythm.     Heart sounds: Normal heart sounds.  Pulmonary:     Effort: Pulmonary effort is normal. No respiratory distress.     Breath sounds: No wheezing.  Abdominal:     General: Bowel sounds are normal. There is no distension.     Palpations: Abdomen is soft.  Musculoskeletal:        General: No deformity. Normal range of motion.     Cervical back: Normal range of motion and neck supple.  Skin:    General: Skin is warm and dry.     Findings: No erythema or rash.  Neurological:     Mental Status: She is alert  and oriented to person, place, and time. Mental status is at baseline.     Cranial Nerves: No cranial nerve deficit.     Coordination: Coordination normal.  Psychiatric:        Mood and Affect: Mood normal.      LABORATORY DATA:  I have reviewed the data as listed    Latest Ref Rng & Units 08/05/2022    8:09 AM 05/04/2022   12:03 PM 04/17/2022   11:06 AM  CBC  WBC 4.0 - 10.5 K/uL 10.6  10.8  8.7   Hemoglobin 12.0 - 15.0 g/dL 97.9  9.2  8.5   Hematocrit 36.0 - 46.0 % 40.0  31.8  29.4   Platelets 150 - 400 K/uL 381  442  539       Latest Ref Rng & Units 04/17/2022   11:06 AM 04/14/2021    9:05 AM  CMP  Glucose 70 - 99 mg/dL 94  892   BUN 6 - 20 mg/dL 8  12   Creatinine 1.19 - 1.00 mg/dL 4.17  4.08   Sodium 144 - 144 mmol/L 138  140   Potassium 3.5 - 5.2 mmol/L 4.0  4.3   Chloride 96 - 106 mmol/L 102  104   CO2 20 - 29 mmol/L 20  23   Calcium 8.7 - 10.2 mg/dL 9.1  9.0   Total Protein 6.0 - 8.5 g/dL 7.0  6.9   Total Bilirubin 0.0 - 1.2 mg/dL <8.1  <8.5   Alkaline Phos 44 - 121 IU/L 53  67   AST 0 - 40 IU/L 12  14   ALT 0 - 32 IU/L 13  15    Lab Results  Component Value Date   IRON 58 08/05/2022   TIBC 428 08/05/2022   FERRITIN 54 08/05/2022      RADIOGRAPHIC STUDIES: I have personally reviewed the radiological images as listed and agreed with the findings in the  report. No results found.

## 2022-08-08 NOTE — Assessment & Plan Note (Signed)
Labs are reviewed and discussed with patient. Hold off IV venofer.

## 2022-08-28 ENCOUNTER — Ambulatory Visit (INDEPENDENT_AMBULATORY_CARE_PROVIDER_SITE_OTHER): Payer: No Typology Code available for payment source

## 2022-08-28 VITALS — BP 136/116 | HR 94 | Resp 16 | Ht 67.0 in | Wt 227.3 lb

## 2022-08-28 DIAGNOSIS — Z3201 Encounter for pregnancy test, result positive: Secondary | ICD-10-CM

## 2022-08-28 DIAGNOSIS — N912 Amenorrhea, unspecified: Secondary | ICD-10-CM

## 2022-08-28 LAB — POCT URINE PREGNANCY: Preg Test, Ur: POSITIVE — AB

## 2022-08-28 NOTE — Progress Notes (Unsigned)
    GYNECOLOGY PROGRESS NOTE  Subjective:    Patient ID: Jodi Conner, female    DOB: 12/04/1985, 36 y.o.   MRN: [id number]  HPI  Patient is a 36 y.o. G0P0000 female who presents for amenorrhea and possible pregnancy. Patient presents today for pregnancy confirmation. UPT resulted in Positive. LMP 07/17/2022 and it was normal.  Pregnancy was unplanned, but desired. Contraception used previously was oral. Symptoms: she has had some spotting and cramping in the last 3-4 days. She had 3 positive home pregnancy test. She has been advised to schedule a NOB Nurse Interview at checkout.  Patient states no other questions or concerns.   The following portions of the patient's history were reviewed and updated as appropriate: allergies, current medications, past family history, past medical history, past social history, past surgical history, and problem list.  Review of Systems Pertinent items are noted in HPI.   Objective:   Blood pressure (!) 136/116, pulse 94, resp. rate 16, height 5\' 7"  (1.702 m), weight 227 lb 4.8 oz (103.1 kg), last menstrual period 07/17/2022. Body mass index is 35.6 kg/m.  General appearance: alert, cooperative, and no distress    Assessment:   1. Amenorrhea      Plan:   1. Amenorrhea  Positive POC Urine HCG Follow up for Nurse Intake Interview     07/19/2022, CMA [facility]

## 2022-08-28 NOTE — Patient Instructions (Signed)
Common Medications Safe in Pregnancy  Acne:      Constipation:  Benzoyl Peroxide     Colace  Clindamycin      Dulcolax Suppository  Topica Erythromycin     Fibercon  Salicylic Acid      Metamucil         Miralax AVOID:        Senakot   Accutane    Cough:  Retin-A       Cough Drops  Tetracycline      Phenergan w/ Codeine if Rx  Minocycline      Robitussin (Plain & DM)  Antibiotics:     Crabs/Lice:  Ceclor       RID  Cephalosporins    AVOID:  E-Mycins      Kwell  Keflex  Macrobid/Macrodantin   Diarrhea:  Penicillin      Kao-Pectate  Zithromax      Imodium AD         PUSH FLUIDS AVOID:       Cipro     Fever:  Tetracycline      Tylenol (Regular or Extra  Minocycline       Strength)  Levaquin      Extra Strength-Do not          Exceed 8 tabs/24 hrs Caffeine:        <200mg/day (equiv. To 1 cup of coffee or  approx. 3 12 oz sodas)         Gas: Cold/Hayfever:       Gas-X  Benadryl      Mylicon  Claritin       Phazyme  **Claritin-D        Chlor-Trimeton    Headaches:  Dimetapp      ASA-Free Excedrin  Drixoral-Non-Drowsy     Cold Compress  Mucinex (Guaifenasin)     Tylenol (Regular or Extra  Sudafed/Sudafed-12 Hour     Strength)  **Sudafed PE Pseudoephedrine   Tylenol Cold & Sinus     Vicks Vapor Rub  Zyrtec  **AVOID if Problems With Blood Pressure         Heartburn: Avoid lying down for at least 1 hour after meals  Aciphex      Maalox     Rash:  Milk of Magnesia     Benadryl    Mylanta       1% Hydrocortisone Cream  Pepcid  Pepcid Complete   Sleep Aids:  Prevacid      Ambien   Prilosec       Benadryl  Rolaids       Chamomile Tea  Tums (Limit 4/day)     Unisom         Tylenol PM         Warm milk-add vanilla or  Hemorrhoids:       Sugar for taste  Anusol/Anusol H.C.  (RX: Analapram 2.5%)  Sugar Substitutes:  Hydrocortisone OTC     Ok in moderation  Preparation H      Tucks        Vaseline lotion applied to tissue with  wiping    Herpes:     Throat:  Acyclovir      Oragel  Famvir  Valtrex     Vaccines:         Flu Shot Leg Cramps:       *Gardasil  Benadryl      Hepatitis A         Hepatitis B Nasal Spray:         Pneumovax  Saline Nasal Spray     Polio Booster         Tetanus Nausea:       Tuberculosis test or PPD  Vitamin B6 25 mg TID   AVOID:    Dramamine      *Gardasil  Emetrol       Live Poliovirus  Ginger Root 250 mg QID    MMR (measles, mumps &  High Complex Carbs @ Bedtime    rebella)  Sea Bands-Accupressure    Varicella (Chickenpox)  Unisom 1/2 tab TID     *No known complications           If received before Pain:         Known pregnancy;   Darvocet       Resume series after  Lortab        Delivery  Percocet    Yeast:   Tramadol      Femstat  Tylenol 3      Gyne-lotrimin  Ultram       Monistat  Vicodin           MISC:         All Sunscreens           Hair Coloring/highlights          Insect Repellant's          (Including DEET)         Mystic Tans Commonly Asked Questions During Pregnancy  Cats: A parasite can be excreted in cat feces.  To avoid exposure you need to have another person empty the little box.  If you must empty the litter box you will need to wear gloves.  Wash your hands after handling your cat.  This parasite can also be found in raw or undercooked meat so this should also be avoided.  Colds, Sore Throats, Flu: Please check your medication sheet to see what you can take for symptoms.  If your symptoms are unrelieved by these medications please call the office.  Dental Work: Most any dental work Investment banker, corporate recommends is permitted.  X-rays should only be taken during the first trimester if absolutely necessary.  Your abdomen should be shielded with a lead apron during all x-rays.  Please notify your provider prior to receiving any x-rays.  Novocaine is fine; gas is not recommended.  If your dentist requires a note from [place] prior to dental work please call the office and  we will provide one for you.  Exercise: Exercise is an important part of staying healthy during your pregnancy.  You may continue most exercises you were accustomed to prior to pregnancy.  Later in your pregnancy you will most likely notice you have difficulty with activities requiring balance like riding a bicycle.  It is important that you listen to your body and avoid activities that put you at a higher risk of falling.  Adequate rest and staying well hydrated are a must!  If you have questions about the safety of specific activities ask your provider.    Exposure to Children with illness: Try to avoid obvious exposure; report any symptoms to [place] when noted,  If you have chicken pos, red measles or mumps, you should be immune to these diseases.   Please do not take any vaccines while pregnant unless you have checked with your OB provider.  Fetal Movement: After 28 weeks we recommend you do "kick counts" twice daily.  Lie or sit down in a calm  quiet environment and count your baby movements "kicks".  You should feel your baby at least 10 times per hour.  If you have not felt 10 kicks within the first hour get up, walk around and have something sweet to eat or drink then repeat for an additional hour.  If count remains less than 10 per hour notify your provider.  Fumigating: Follow your pest control agent's advice as to how long to stay out of your home.  Ventilate the area well before re-entering.  Hemorrhoids:   Most over-the-counter preparations can be used during pregnancy.  Check your medication to see what is safe to use.  It is important to use a stool softener or fiber in your diet and to drink lots of liquids.  If hemorrhoids seem to be getting worse please call the office.   Hot Tubs:  Hot tubs Jacuzzis and saunas are not recommended while pregnant.  These increase your internal body temperature and should be avoided.  Intercourse:  Sexual intercourse is safe during pregnancy as long as you  are comfortable, unless otherwise advised by your provider.  Spotting may occur after intercourse; report any bright red bleeding that is heavier than spotting.  Labor:  If you know that you are in labor, please go to the hospital.  If you are unsure, please call the office and let us help you decide what to do.  Lifting, straining, etc:  If your job requires heavy lifting or straining please check with your provider for any limitations.  Generally, you should not lift items heavier than that you can lift simply with your hands and arms (no back muscles)  Painting:  Paint fumes do not harm your pregnancy, but may make you ill and should be avoided if possible.  Latex or water based paints have less odor than oils.  Use adequate ventilation while painting.  Permanents & Hair Color:  Chemicals in hair dyes are not recommended as they cause increase hair dryness which can increase hair loss during pregnancy.  " Highlighting" and permanents are allowed.  Dye may be absorbed differently and permanents may not hold as well during pregnancy.  Sunbathing:  Use a sunscreen, as skin burns easily during pregnancy.  Drink plenty of fluids; avoid over heating.  Tanning Beds:  Because their possible side effects are still unknown, tanning beds are not recommended.  Ultrasound Scans:  Routine ultrasounds are performed at approximately 20 weeks.  You will be able to see your baby's general anatomy an if you would like to know the gender this can usually be determined as well.  If it is questionable when you conceived you may also receive an ultrasound early in your pregnancy for dating purposes.  Otherwise ultrasound exams are not routinely performed unless there is a medical necessity.  Although you can request a scan we ask that you pay for it when conducted because insurance does not cover " patient request" scans.  Work: If your pregnancy proceeds without complications you may work until your due date, unless  your physician or employer advises otherwise.  Round Ligament Pain/Pelvic Discomfort:  Sharp, shooting pains not associated with bleeding are fairly common, usually occurring in the second trimester of pregnancy.  They tend to be worse when standing up or when you remain standing for long periods of time.  These are the result of pressure of certain pelvic ligaments called "round ligaments".  Rest, Tylenol and heat seem to be the most effective relief.  As the  womb and fetus grow, they rise out of the pelvis and the discomfort improves.  Please notify the office if your pain seems different than that described.  It may represent a more serious condition.  First Trimester of Pregnancy  The first trimester of pregnancy starts on the first day of your last menstrual period until the end of week 12. This is also called months 1 through 3 of pregnancy. Body changes during your first trimester Your body goes through many changes during pregnancy. The changes usually return to normal after your baby is born. Physical changes You may gain or lose weight. Your breasts may grow larger and hurt. The area around your nipples may get darker. Dark spots or blotches may develop on your face. You may have changes in your hair. Health changes You may feel like you might vomit (nauseous), and you may vomit. You may have heartburn. You may have headaches. You may have trouble pooping (constipation). Your gums may bleed. Other changes You may get tired easily. You may pee (urinate) more often. Your menstrual periods will stop. You may not feel hungry. You may want to eat certain kinds of food. You may have changes in your emotions from day to day. You may have more dreams. Follow these instructions at home: Medicines Take over-the-counter and prescription medicines only as told by your doctor. Some medicines are not safe during pregnancy. Take a prenatal vitamin that contains at least 600 micrograms (mcg)  of folic acid. Eating and drinking Eat healthy meals that include: Fresh fruits and vegetables. Whole grains. Good sources of protein, such as meat, eggs, or tofu. Low-fat dairy products. Avoid raw meat and unpasteurized juice, milk, and cheese. If you feel like you may vomit, or you vomit: Eat 4 or 5 small meals a day instead of 3 large meals. Try eating a few soda crackers. Drink liquids between meals instead of during meals. You may need to take these actions to prevent or treat trouble pooping: Drink enough fluids to keep your pee (urine) pale yellow. Eat foods that are high in fiber. These include beans, whole grains, and fresh fruits and vegetables. Limit foods that are high in fat and sugar. These include fried or sweet foods. Activity Exercise only as told by your doctor. Most people can do their usual exercise routine during pregnancy. Stop exercising if you have cramps or pain in your lower belly (abdomen) or low back. Do not exercise if it is too hot or too humid, or if you are in a place of great height (high altitude). Avoid heavy lifting. If you choose to, you may have sex unless your doctor tells you not to. Relieving pain and discomfort Wear a good support bra if your breasts are sore. Rest with your legs raised (elevated) if you have leg cramps or low back pain. If you have bulging veins (varicose veins) in your legs: Wear support hose as told by your doctor. Raise your feet for 15 minutes, 3-4 times a day. Limit salt in your food. Safety Wear your seat belt at all times when you are in a car. Talk with your doctor if someone is hurting you or yelling at you. Talk with your doctor if you are feeling sad or have thoughts of hurting yourself. Lifestyle Do not use hot tubs, steam rooms, or saunas. Do not douche. Do not use tampons or scented sanitary pads. Do not use herbal medicines, illegal drugs, or medicines that are not approved by your doctor. Do  not drink  alcohol. Do not smoke or use any products that contain nicotine or tobacco. If you need help quitting, ask your doctor. Avoid cat litter boxes and soil that is used by cats. These carry germs that can cause harm to the baby and can cause a loss of your baby by miscarriage or stillbirth. General instructions Keep all follow-up visits. This is important. Ask for help if you need counseling or if you need help with nutrition. Your doctor can give you advice or tell you where to go for help. Visit your dentist. At home, brush your teeth with a soft toothbrush. Floss gently. Write down your questions. Take them to your prenatal visits. Where to find more information American Pregnancy Association: americanpregnancy.org [employer]ologists: www.acog.org Office on Women's Health: KeywordPortfolios.com.br Contact a doctor if: You are dizzy. You have a fever. You have mild cramps or pressure in your lower belly. You have a nagging pain in your belly area. You continue to feel like you may vomit, you vomit, or you have watery poop (diarrhea) for 24 hours or longer. You have a bad-smelling fluid coming from your vagina. You have pain when you pee. You are exposed to a disease that spreads from person to person, such as chickenpox, measles, Zika virus, HIV, or hepatitis. Get help right away if: You have spotting or bleeding from your vagina. You have very bad belly cramping or pain. You have shortness of breath or chest pain. You have any kind of injury, such as from a fall or a car crash. You have new or increased pain, swelling, or redness in an arm or leg. Summary The first trimester of pregnancy starts on the first day of your last menstrual period until the end of week 12 (months 1 through 3). Eat 4 or 5 small meals a day instead of 3 large meals. Do not smoke or use any products that contain nicotine or tobacco. If you need help quitting, ask your  doctor. Keep all follow-up visits. This information is not intended to replace advice given to you by your health care provider. Make sure you discuss any questions you have with your health care provider. Document Revised: 02/14/2020 Document Reviewed: 12/21/2019 Elsevier Patient Education  [place].    Morning Sickness  Morning sickness is when you feel like you may vomit (feel nauseous) during pregnancy. Sometimes, you may vomit. Morning sickness most often happens in the morning, but it can also happen at any time of the day. Some women may have morning sickness that makes them vomit all the time. This is a more serious problem that needs treatment. What are the causes? The cause of this condition is not known. What increases the risk? You had vomiting or a feeling like you may vomit before your pregnancy. You had morning sickness in another pregnancy. You are pregnant with more than one baby, such as twins. What are the signs or symptoms? Feeling like you may vomit. Vomiting. How is this treated? Treatment is usually not needed for this condition. You may only need to change what you eat. In some cases, your doctor may give you some things to take for your condition. These include: Vitamin B6 supplements. Medicines to treat the feeling that you may vomit. Ginger. Follow these instructions at home: Medicines Take over-the-counter and prescription medicines only as told by your doctor. Do not take any medicines until you talk with your doctor about them first. Take multivitamins before you  get pregnant. These can stop or lessen the symptoms of morning sickness. Eating and drinking Eat dry toast or crackers before getting out of bed. Eat 5 or 6 small meals a day. Eat dry and bland foods like rice and baked potatoes. Do not eat greasy, fatty, or spicy foods. Have someone cook for you if the smell of food causes you to vomit or to feel like you may vomit. If you feel like  you may vomit after taking prenatal vitamins, take them at night or with a snack. Eat protein foods when you need a snack. Nuts, yogurt, and cheese are good choices. Drink fluids throughout the day. Try ginger ale made with real ginger, ginger tea made from fresh grated ginger, or ginger candies. General instructions Do not smoke or use any products that contain nicotine or tobacco. If you need help quitting, ask your doctor. Use an air purifier to keep the air in your house free of smells. Get lots of fresh air. Try to avoid smells that make you feel sick. Try wearing an acupressure wristband. This is a wristband that is used to treat seasickness. Try a treatment called acupuncture. In this treatment, a doctor puts needles into certain areas of your body to make you feel better. Contact a doctor if: You need medicine to feel better. You feel dizzy or light-headed. You are losing weight. Get help right away if: The feeling that you may vomit will not go away, or you cannot stop vomiting. You faint. You have very bad pain in your belly. Summary Morning sickness is when you feel like you may vomit (feel nauseous) during pregnancy. You may feel sick in the morning, but you can feel this way at any time of the day. Making some changes to what you eat may help your symptoms go away. This information is not intended to replace advice given to you by your health care provider. Make sure you discuss any questions you have with your health care provider. Document Revised: 04/22/2020 Document Reviewed: 04/01/2020 Elsevier Patient Education  [place].

## 2022-08-31 ENCOUNTER — Encounter: Payer: Self-pay | Admitting: Advanced Practice Midwife

## 2022-09-04 ENCOUNTER — Ambulatory Visit (INDEPENDENT_AMBULATORY_CARE_PROVIDER_SITE_OTHER): Payer: No Typology Code available for payment source

## 2022-09-04 VITALS — Wt 219.0 lb

## 2022-09-04 DIAGNOSIS — Z3480 Encounter for supervision of other normal pregnancy, unspecified trimester: Secondary | ICD-10-CM

## 2022-09-04 DIAGNOSIS — Z369 Encounter for antenatal screening, unspecified: Secondary | ICD-10-CM

## 2022-09-04 NOTE — Progress Notes (Deleted)
New OB Intake  I connected with  Jodi Conner on 09/04/22 at  2:15 PM EST by telephone and verified that I am speaking with the correct person using two identifiers. Nurse is located at [facility] and pt is located at home.  I explained I am completing New OB Intake today. We discussed her EDD of 04/18/2023 that is based on LMP of 07/12/2022. Pt is G1/P0. I reviewed her allergies, medications, Medical/Surgical/OB history, and appropriate screenings. Based on history, this is a/an pregnancy uncomplicated .   Patient Active Problem List   Diagnosis Date Noted   Prediabetes 07/17/2022   Hypertension 06/05/2022   Iron deficiency anemia 05/04/2022   Mixed hyperlipidemia 04/16/2022   Soft tissue lesion of foot 04/16/2022   Elevated fasting glucose 04/16/2022   Annual physical exam 04/14/2021   Allergic rhinitis 04/14/2021   Anxiety and depression 04/08/2021   Awakens from sleep at night 04/08/2021    Concerns addressed today None  Delivery Plans:  Plans to deliver at [facility].  Anatomy US Explained first scheduled [place] will be 1/3rd and an anatomy scan will be done at 20 weeks.  Labs Discussed genetic screening with patient. Patient desires genetic testing to be drawn with new OB labs. Discussed possible labs to be drawn at new OB appointment.  COVID Vaccine Patient has had COVID vaccine.   Social Determinants of Health Food Insecurity: denies food insecurity Transportation: Patient denies transportation needs.  First visit review I reviewed new OB appt with pt. I explained she will have ob bloodwork and pap smear/pelvic exam if indicated. Explained pt will be seen by Carie Caddy, CNM at first visit; encounter routed to appropriate provider.   Jodi Conner, [place] 09/04/2022  2:22 PM

## 2022-09-04 NOTE — Progress Notes (Cosign Needed Addendum)
New OB Intake  I connected with  Jodi Conner on 09/04/22 at  2:15 PM EST by telephone and verified that I am speaking with the correct person using two identifiers. Nurse is located at [facility] and pt is located at home.  I explained I am completing New OB Intake today. We discussed her EDD of 04/18/2023 that is based on LMP of 07/12/2022. Pt is G1/P0. I reviewed her allergies, medications, Medical/Surgical/OB history, and appropriate screenings. Based on history, this is a/an pregnancy uncomplicated .   Patient Active Problem List   Diagnosis Date Noted   Supervision of other normal pregnancy, antepartum 09/04/2022   Prediabetes 07/17/2022   Hypertension 06/05/2022   Iron deficiency anemia 05/04/2022   Mixed hyperlipidemia 04/16/2022   Soft tissue lesion of foot 04/16/2022   Elevated fasting glucose 04/16/2022   Annual physical exam 04/14/2021   Allergic rhinitis 04/14/2021   Anxiety and depression 04/08/2021   Awakens from sleep at night 04/08/2021    Concerns addressed today None  Delivery Plans:  Plans to deliver at [facility].  Anatomy US Explained first scheduled [place] will be 13rd and an anatomy scan will be done at 20 weeks.  Labs Discussed genetic screening with patient. Patient desires genetic testing to be drawn with new OB labs. Discussed possible labs to be drawn at new OB appointment.  COVID Vaccine Patient has had COVID vaccine.   Social Determinants of Health Food Insecurity: denies food insecurity Transportation: Patient denies transportation needs.  First visit review I reviewed new OB appt with pt. I explained she will have ob bloodwork and pap smear/pelvic exam if indicated. Explained pt will be seen by Carie Caddy, CNM at first visit; encounter routed to appropriate provider.   Vittoria Noreen, [place] 09/04/2022  2:40 PM

## 2022-09-22 ENCOUNTER — Telehealth: Payer: Self-pay | Admitting: Licensed Practical Nurse

## 2022-09-22 NOTE — Telephone Encounter (Signed)
I contacted patient via phone x2. Voicemail is full unable to leave message. Our ultrasound tech is out of the office. Patient is needing to contacted centralized scheduling for rescheduled.

## 2022-09-23 ENCOUNTER — Other Ambulatory Visit: Payer: No Typology Code available for payment source

## 2022-09-23 ENCOUNTER — Other Ambulatory Visit: Payer: Self-pay

## 2022-09-23 ENCOUNTER — Emergency Department: Payer: No Typology Code available for payment source

## 2022-09-23 ENCOUNTER — Emergency Department
Admission: EM | Admit: 2022-09-23 | Discharge: 2022-09-24 | Disposition: A | Discharge status: Home / Self Care | Payer: No Typology Code available for payment source | Attending: Emergency Medicine | Admitting: Emergency Medicine

## 2022-09-23 DIAGNOSIS — Z3A00 Weeks of gestation of pregnancy not specified: Secondary | ICD-10-CM | POA: Insufficient documentation

## 2022-09-23 DIAGNOSIS — Z2914 Encounter for prophylactic rabies immune globin: Secondary | ICD-10-CM | POA: Insufficient documentation

## 2022-09-23 DIAGNOSIS — O209 Hemorrhage in early pregnancy, unspecified: Secondary | ICD-10-CM | POA: Diagnosis not present

## 2022-09-23 DIAGNOSIS — R102 Pelvic and perineal pain: Secondary | ICD-10-CM | POA: Diagnosis not present

## 2022-09-23 LAB — CBC WITH DIFFERENTIAL/PLATELET
Abs Immature Granulocytes: 0.05 10*3/uL (ref 0.00–0.07)
Basophils Absolute: 0.1 10*3/uL (ref 0.0–0.1)
Basophils Relative: 1 %
Eosinophils Absolute: 0.3 10*3/uL (ref 0.0–0.5)
Eosinophils Relative: 2 %
HCT: 39.3 % (ref 36.0–46.0)
Hemoglobin: 13.6 g/dL (ref 12.0–15.0)
Immature Granulocytes: 0 %
Lymphocytes Relative: 21 %
Lymphs Abs: 3.2 10*3/uL (ref 0.7–4.0)
MCH: 29.8 pg (ref 26.0–34.0)
MCHC: 34.6 g/dL (ref 30.0–36.0)
MCV: 86 fL (ref 80.0–100.0)
Monocytes Absolute: 0.8 10*3/uL (ref 0.1–1.0)
Monocytes Relative: 5 %
Neutro Abs: 11.2 10*3/uL — ABNORMAL HIGH (ref 1.7–7.7)
Neutrophils Relative %: 71 %
Platelets: 417 10*3/uL — ABNORMAL HIGH (ref 150–400)
RBC: 4.57 MIL/uL (ref 3.87–5.11)
RDW: 13.7 % (ref 11.5–15.5)
WBC: 15.6 10*3/uL — ABNORMAL HIGH (ref 4.0–10.5)
nRBC: 0 % (ref 0.0–0.2)

## 2022-09-23 LAB — HCG, QUANTITATIVE, PREGNANCY: hCG, Beta Chain, Quant, S: 5241 m[IU]/mL — ABNORMAL HIGH (ref <5)

## 2022-09-23 LAB — BASIC METABOLIC PANEL
Anion gap: 12 (ref 5–15)
BUN: 12 mg/dL (ref 6–20)
CO2: 23 mmol/L (ref 22–32)
Calcium: 9.1 mg/dL (ref 8.9–10.3)
Chloride: 100 mmol/L (ref 98–111)
Creatinine, Ser: 0.81 mg/dL (ref 0.44–1.00)
GFR, Estimated: >60 mL/min (ref >60)
Glucose, Bld: 141 mg/dL — ABNORMAL HIGH (ref 70–99)
Potassium: 2.8 mmol/L — ABNORMAL LOW (ref 3.5–5.1)
Sodium: 135 mmol/L (ref 135–145)

## 2022-09-23 MED ORDER — HYDROCODONE-ACETAMINOPHEN 5-325 MG PO TABS: 1.0000 | ORAL_TABLET | Freq: Three times a day (TID) | ORAL | 0 refills | Status: AC | PRN

## 2022-09-23 MED ORDER — POTASSIUM CHLORIDE CRYS ER 20 MEQ PO TBCR
40.0000 meq | EXTENDED_RELEASE_TABLET | Freq: Once | ORAL | Status: AC
  Administered 2022-09-24: 40 meq via ORAL
  Filled 2022-09-23: qty 2

## 2022-09-23 MED ORDER — ONDANSETRON 4 MG PO TBDP: 4.0000 mg | ORAL_TABLET | Freq: Three times a day (TID) | ORAL | 0 refills | Status: DC | PRN

## 2022-09-23 MED ORDER — ACETAMINOPHEN 325 MG PO TABS
650.0000 mg | ORAL_TABLET | Freq: Once | ORAL | Status: AC
  Administered 2022-09-24: 650 mg via ORAL
  Filled 2022-09-23: qty 2

## 2022-09-23 MED ORDER — HYDROCODONE-ACETAMINOPHEN 5-325 MG PO TABS
1.0000 | ORAL_TABLET | Freq: Once | ORAL | Status: AC
  Administered 2022-09-23: 1 via ORAL
  Filled 2022-09-23: qty 1

## 2022-09-23 NOTE — Discharge Instructions (Addendum)
Your hcg is 5,400, but your [place] does not show an intrauterine pregnancy (IUP) at this time. This may be due to a very early pregnancy, spontaneous miscarriage, or less likely, an ectopic pregnancy. Follow-up with your OB provider on Friday for repeat labs. Return to the ED for increased pain, bleeding, or any concerning symptoms.

## 2022-09-23 NOTE — ED Provider Notes (Signed)
[facility] Emergency Department Provider Note     Event Date/Time   First MD Initiated Contact with Patient 09/23/22 2048     (approximate)   History   No chief complaint on file.   HPI  Jodi Conner is a 37 y.o. female complains of vaginal bleeding with onset today.  G1, P0 female reports LMP of 10/27.  She reports some scant spotting today, has progressed to heavier flow with passage of clots.  She also endorses some bilateral pelvic cramping at this time.  Patient apparently is on birth control pills, but does endorse a recent antibiotic course without backup barrier protection.  She realized at some point last month, that had not gotten her menstrual period as expected.  She had 3 consecutive positive home pregnancy test.  Physical Exam   Triage Vital Signs: ED Triage Vitals  Enc Vitals Group     BP 09/23/22 1855 (!) 153/89     Pulse Rate 09/23/22 1855 (!) 110     Resp 09/23/22 1855 (!) 22     Temp 09/23/22 1855 98 F (36.7 C)     Temp Source 09/23/22 1855 Oral     SpO2 09/23/22 1855 97 %     Weight 09/23/22 1902 218 lb 14.7 oz (99.3 kg)     Height 09/23/22 1902 5\' 7"  (1.702 m)     Head Circumference --      Peak Flow --      Pain Score 09/23/22 1902 8     Pain Loc --      Pain Edu? --      Excl. in [place]? --     Most recent vital signs: Vitals:   09/23/22 1855  BP: (!) 153/89  Pulse: (!) 110  Resp: (!) 22  Temp: 98 F (36.7 C)  SpO2: 97%    General Awake, no distress. Uncomfortable CV:  Good peripheral perfusion.  RESP:  Normal effort.  ABD:  No distention.  GU:  deferred   ED Results / Procedures / Treatments   Labs (all labs ordered are listed, but only abnormal results are displayed) Labs Reviewed  BASIC METABOLIC PANEL - Abnormal; Notable for the following components:      Result Value   Potassium 2.8 (*)    Glucose, Bld 141 (*)    All other components within normal limits  CBC WITH DIFFERENTIAL/PLATELET -  Abnormal; Notable for the following components:   WBC 15.6 (*)    Platelets 417 (*)    Neutro Abs 11.2 (*)    All other components within normal limits  HCG, QUANTITATIVE, PREGNANCY - Abnormal; Notable for the following components:   hCG, Beta Chain, Quant, S 5,241 (*)    All other components within normal limits  URINALYSIS, ROUTINE W REFLEX MICROSCOPIC  POC URINE PREG, ED  ABO/RH     EKG   RADIOLOGY  I personally viewed and evaluated these images as part of my medical decision making, as well as reviewing the written report by the radiologist.  ED Provider Interpretation: no IUP, adnexal masses or free fluid noted  US OB LESS THAN 14 WEEKS WITH OB TRANSVAGINAL  Result Date: 09/23/2022 CLINICAL DATA:  Vaginal bleeding x1 day. EXAM: OBSTETRIC <14 WK [place] AND TRANSVAGINAL OB US TECHNIQUE: Both transabdominal and transvaginal ultrasound examinations were performed for complete evaluation of the gestation as well as the maternal uterus, adnexal regions, and pelvic cul-de-sac. Transvaginal technique was performed to assess early pregnancy. COMPARISON:  None Available. FINDINGS: Intrauterine gestational sac: None Yolk sac:  Not Visualized. Embryo:  Not Visualized. Cardiac Activity: Not Visualized. Heart Rate: N/A  bpm Subchorionic hemorrhage:  None visualized. Maternal uterus/adnexae: The uterus measures 8.9 cm x 4.9 cm x 5.8 cm. The endometrium measures 1.8 cm in thickness and is heterogeneous in appearance. The right ovary measures 3.8 cm x 2.1 cm x 2.5 cm and is normal in appearance. The left ovary measures 4.1 cm x 2.8 cm x 2.5 cm and is normal in appearance. No pelvic free fluid is identified. IMPRESSION: Thickened, heterogeneous endometrium without evidence of an intrauterine pregnancy. Electronically Signed   By: Virgina Norfolk M.D.   On: 09/23/2022 21:37     PROCEDURES:  Critical Care performed: No  Procedures   MEDICATIONS ORDERED IN ED: Medications  acetaminophen (TYLENOL)  tablet 650 mg (has no administration in time range)  potassium chloride SA (KLOR-CON M) CR tablet 40 mEq (has no administration in time range)  HYDROcodone-acetaminophen (NORCO/VICODIN) 5-325 MG per tablet 1 tablet (1 tablet Oral Given 09/23/22 1906)     IMPRESSION / MDM / ASSESSMENT AND PLAN / ED COURSE  I reviewed the triage vital signs and the nursing notes.                              Differential diagnosis includes, but is not limited to, .armcedd  Patient's presentation is most consistent with acute complicated illness / injury requiring diagnostic workup.  Patient G1P0 presents to the ED for evaluation of sudden onset of bilateral pelvic pain and vaginal bleeding.  Patient believes up to be approximately [redacted] weeks pregnant based on her LMP.  She was found to have a beta quant of greater than 5400.  Further evaluation with ultrasound, not reveal a IUP at this time.  No evidence of any adnexal masses or free fluid in the pelvis.  Otherwise stable at the time of this interim evaluation, noting significant improvement of her pelvic pain.  She continues to endorse moderate vaginal bleeding.  Patient's diagnosis is consistent with vaginal bleeding in first trimester.  Differential at this point includes early IUP of an unknown anatomic location.  Spontaneous miscarriage, or possible ectopic pregnancy.  Is also found to have mildly decreased potassium at 2.8.  Patient will be discharged home with prescriptions for hydrocodone and Zofran. Patient is to follow up with OB provider in 2 days for serial quant evaluation.  As needed or otherwise directed. Patient is given ED precautions to return to the ED for any worsening or new symptoms.     FINAL CLINICAL IMPRESSION(S) / ED DIAGNOSES   Final diagnoses:  Vaginal bleeding in pregnancy, first trimester     Rx / DC Orders   ED Discharge Orders          Ordered    HYDROcodone-acetaminophen (NORCO) 5-325 MG tablet  3 times daily PRN         09/23/22 2325    ondansetron (ZOFRAN-ODT) 4 MG disintegrating tablet  Every 8 hours PRN        09/23/22 2325             Note:  This document was prepared using Dragon voice recognition software and may include unintentional dictation errors.    Melvenia Needles, PA-C 09/23/22 2341    Harvest Dark, MD 09/24/22 2255

## 2022-09-23 NOTE — ED Triage Notes (Signed)
Reports spotting x several days, but heavier vaginal bleeding today.  States filled a panty liner and tampon in 1-2 hours.  LMP:07/17/2022.  G1 P0

## 2022-09-23 NOTE — ED Provider Triage Note (Signed)
**Note Jodi-Identified via Obfuscation** Emergency Medicine Provider Triage Evaluation Note  Jodi Conner , a 37 y.o. female  was evaluated in triage.  Pt complains of vaginal bleeding with onset today.  G1, P0 female reports LMP of 10/27.  She reports some scant spotting today, has progressed to heavier flow with passage of clots.  She also endorses some pelvic cramping at this time.  Review of Systems  Positive: Vag bleeding in pregnancy Negative: NVD  Physical Exam  BP (!) 153/89   Pulse (!) 110   Temp 98 F (36.7 C) (Oral)   Resp (!) 22   LMP 07/12/2022   SpO2 97%  Gen:   Awake, no distress  NAD Resp:  Normal effort  MSK:   Moves extremities without difficulty  Other:    Medical Decision Making  Medically screening exam initiated at 7:00 PM.  Appropriate orders placed.  Jodi Conner was informed that the remainder of the evaluation will be completed by another provider, this initial triage assessment does not replace that evaluation, and the importance of remaining in the ED until their evaluation is complete.  Patient to the ED for vaginal bleeding in first trimester.   Melvenia Needles, [place] 09/23/22 1904

## 2022-09-23 NOTE — Telephone Encounter (Signed)
Patient's ultrasound was scheduled for 1/9 and labs rescheduled for that day. Patient is aware of all information

## 2022-09-24 ENCOUNTER — Other Ambulatory Visit: Payer: Self-pay | Admitting: Advanced Practice Midwife

## 2022-09-24 DIAGNOSIS — O200 Threatened abortion: Secondary | ICD-10-CM

## 2022-09-24 LAB — URINALYSIS, ROUTINE W REFLEX MICROSCOPIC
Bacteria, UA: NONE SEEN
Bilirubin Urine: NEGATIVE
Glucose, UA: NEGATIVE mg/dL
Ketones, ur: NEGATIVE mg/dL
Leukocytes,Ua: NEGATIVE
Nitrite: NEGATIVE
Protein, ur: 100 mg/dL — AB
RBC / HPF: >50 RBC/hpf — ABNORMAL HIGH (ref 0–5)
Specific Gravity, Urine: 1.027 (ref 1.005–1.030)
pH: 5 (ref 5.0–8.0)

## 2022-09-24 LAB — ABO/RH: ABO/RH(D): O NEG

## 2022-09-24 LAB — ANTIBODY SCREEN: Antibody Screen: NEGATIVE

## 2022-09-24 MED ORDER — RHO D IMMUNE GLOBULIN 1500 UNIT/2ML IJ SOSY
300.0000 ug | PREFILLED_SYRINGE | Freq: Once | INTRAMUSCULAR | Status: AC
  Administered 2022-09-24: 300 ug via INTRAMUSCULAR
  Filled 2022-09-24: qty 2

## 2022-09-24 NOTE — ED Notes (Signed)
Lab stated initial pink top was hemolyzed, new 2nd pink was sent.

## 2022-09-24 NOTE — ED Notes (Signed)
Pt denies any s/s of an allergic reaction. Pt ok to be dc'd.

## 2022-09-24 NOTE — Progress Notes (Signed)
Order placed for follow up beta hcg. Patient to call office for ER f/u appointment 09/25/22

## 2022-09-25 ENCOUNTER — Other Ambulatory Visit: Payer: No Typology Code available for payment source

## 2022-09-25 DIAGNOSIS — O200 Threatened abortion: Secondary | ICD-10-CM

## 2022-09-25 LAB — RHOGAM INJECTION: Unit division: 0

## 2022-09-26 ENCOUNTER — Other Ambulatory Visit: Payer: Self-pay | Admitting: Advanced Practice Midwife

## 2022-09-26 DIAGNOSIS — O200 Threatened abortion: Secondary | ICD-10-CM

## 2022-09-26 LAB — BETA HCG QUANT (REF LAB): hCG Quant: 620 m[IU]/mL

## 2022-09-26 NOTE — Progress Notes (Signed)
Message to patient to schedule a lab visit in 1 week for f/u beta level

## 2022-09-29 ENCOUNTER — Other Ambulatory Visit: Payer: No Typology Code available for payment source

## 2022-09-29 ENCOUNTER — Ambulatory Visit: Admission: RE | Admit: 2022-09-29 | Payer: No Typology Code available for payment source | Source: Ambulatory Visit

## 2022-09-30 ENCOUNTER — Encounter: Payer: No Typology Code available for payment source | Admitting: Licensed Practical Nurse

## 2022-10-02 ENCOUNTER — Other Ambulatory Visit: Payer: No Typology Code available for payment source

## 2022-10-02 DIAGNOSIS — O200 Threatened abortion: Secondary | ICD-10-CM

## 2022-10-03 LAB — BETA HCG QUANT (REF LAB): hCG Quant: 57 m[IU]/mL

## 2022-10-05 ENCOUNTER — Encounter: Payer: Self-pay | Admitting: Family Medicine

## 2022-10-05 ENCOUNTER — Ambulatory Visit: Payer: No Typology Code available for payment source | Admitting: Family Medicine

## 2022-10-05 VITALS — BP 128/80 | HR 90 | Ht 67.0 in | Wt 229.0 lb

## 2022-10-05 DIAGNOSIS — J22 Unspecified acute lower respiratory infection: Secondary | ICD-10-CM | POA: Diagnosis not present

## 2022-10-05 DIAGNOSIS — K219 Gastro-esophageal reflux disease without esophagitis: Secondary | ICD-10-CM | POA: Insufficient documentation

## 2022-10-05 DIAGNOSIS — B009 Herpesviral infection, unspecified: Secondary | ICD-10-CM | POA: Insufficient documentation

## 2022-10-05 DIAGNOSIS — N920 Excessive and frequent menstruation with regular cycle: Secondary | ICD-10-CM | POA: Insufficient documentation

## 2022-10-05 DIAGNOSIS — N870 Mild cervical dysplasia: Secondary | ICD-10-CM | POA: Insufficient documentation

## 2022-10-05 MED ORDER — PROMETHAZINE-DM 6.25-15 MG/5ML PO SYRP: 5.0000 mL | ORAL_SOLUTION | Freq: Four times a day (QID) | ORAL | 0 refills | Status: DC | PRN

## 2022-10-05 MED ORDER — AZITHROMYCIN 250 MG PO TABS: ORAL_TABLET | ORAL | 0 refills | Status: AC

## 2022-10-05 NOTE — Patient Instructions (Addendum)
-  Take antibiotics for full course - Use albuterol inhaler every 4-6 hours scheduled until symptoms alleviate - Use Nasonex daily scheduled - Transition from Claritin to [place] Mucinex twice daily - Can use Rx cough medicine on as-needed basis - Contact our office for any persistent symptoms without improvement after the antibiotics - Maintain follow-up as scheduled

## 2022-10-05 NOTE — Assessment & Plan Note (Signed)
3-day history of severe cough, intermittently productive of yellowish sputum, denies any fevers, chills, facial pressure, throat pain, ear pain, no significant shortness of air or chest pain, no GI or urinary change.  Examination with mildly ill-appearing individual repeatedly coughing, exam significant for erythematous and slightly swollen turbinates, otherwise nontender sinuses, benign tympanic membranes canals, oropharynx, there is air entry throughout all lung fields still the left lower field with mild rhonchi that does not alleviate with cough, faint wheezes throughout on end expiration, cardiac findings benign.  Clinical concern for lower respiratory tract infection, azithromycin course, scheduled albuterol, OTC regimen, she has scheduled follow-up in a few weeks but I have advised her to contact us sooner if symptoms fail to improve.

## 2022-10-05 NOTE — Progress Notes (Signed)
     Primary Care / Sports Medicine Office Visit  Patient Information:  Patient ID: Jodi Conner, female DOB: [birth year] Age: 37 y.o. MRN: [id number]   Jodi Conner is a pleasant 37 y.o. female presenting with the following:  Chief Complaint  Patient presents with   Cough    Pt started coughing on Friday    Vitals:   10/05/22 0803  BP: 128/80  Pulse: 90  SpO2: 98%   Vitals:   10/05/22 0803  Weight: 229 lb (103.9 kg)  Height: 5\' 7"  (1.702 m)   Body mass index is 35.87 kg/m.     Independent interpretation of notes and tests performed by another provider:   None  Procedures performed:   None  Pertinent History, Exam, Impression, and Recommendations:   Jodi Conner was seen today for cough.  Lower respiratory infection Assessment & Plan: 3-day history of severe cough, intermittently productive of yellowish sputum, denies any fevers, chills, facial pressure, throat pain, ear pain, no significant shortness of air or chest pain, no GI or urinary change.  Examination with mildly ill-appearing individual repeatedly coughing, exam significant for erythematous and slightly swollen turbinates, otherwise nontender sinuses, benign tympanic membranes canals, oropharynx, there is air entry throughout all lung fields still the left lower field with mild rhonchi that does not alleviate with cough, faint wheezes throughout on end expiration, cardiac findings benign.  Clinical concern for lower respiratory tract infection, azithromycin course, scheduled albuterol, OTC regimen, she has scheduled follow-up in a few weeks but I have advised her to contact us sooner if symptoms fail to improve.  Orders: -     Azithromycin; Take 2 tablets on day 1, then 1 tablet daily on days 2 through 5  Dispense: 6 tablet; Refill: 0 -     Promethazine-DM; Take 5 mLs by mouth 4 (four) times daily as needed for cough.  Dispense: 118 mL; Refill: 0     Orders & Medications Meds ordered this encounter   Medications   azithromycin (ZITHROMAX) 250 MG tablet    Sig: Take 2 tablets on day 1, then 1 tablet daily on days 2 through 5    Dispense:  6 tablet    Refill:  0   promethazine-dextromethorphan (PROMETHAZINE-DM) 6.25-15 MG/5ML syrup    Sig: Take 5 mLs by mouth 4 (four) times daily as needed for cough.    Dispense:  118 mL    Refill:  0   No orders of the defined types were placed in this encounter.    No follow-ups on file.     [name], MD, [facility]   Primary Care Sports Medicine Primary Care and Sports Medicine at [facility]

## 2022-10-08 ENCOUNTER — Encounter: Payer: Self-pay | Admitting: Family Medicine

## 2022-10-09 ENCOUNTER — Other Ambulatory Visit: Payer: Self-pay | Admitting: Family Medicine

## 2022-10-09 MED ORDER — METHYLPREDNISOLONE 4 MG PO TBPK: ORAL_TABLET | ORAL | 0 refills | Status: DC

## 2022-10-09 NOTE — Telephone Encounter (Signed)
Please advise 

## 2022-10-19 ENCOUNTER — Ambulatory Visit: Payer: No Typology Code available for payment source | Admitting: Family Medicine

## 2022-10-19 ENCOUNTER — Encounter: Payer: Self-pay | Admitting: Family Medicine

## 2022-10-19 VITALS — BP 126/80 | HR 84 | Ht 67.0 in | Wt 231.0 lb

## 2022-10-19 DIAGNOSIS — I10 Essential (primary) hypertension: Secondary | ICD-10-CM | POA: Diagnosis not present

## 2022-10-19 DIAGNOSIS — F419 Anxiety disorder, unspecified: Secondary | ICD-10-CM

## 2022-10-19 DIAGNOSIS — F32A Depression, unspecified: Secondary | ICD-10-CM

## 2022-10-19 DIAGNOSIS — R7301 Impaired fasting glucose: Secondary | ICD-10-CM

## 2022-10-19 DIAGNOSIS — J309 Allergic rhinitis, unspecified: Secondary | ICD-10-CM

## 2022-10-19 DIAGNOSIS — G4733 Obstructive sleep apnea (adult) (pediatric): Secondary | ICD-10-CM

## 2022-10-19 DIAGNOSIS — E782 Mixed hyperlipidemia: Secondary | ICD-10-CM

## 2022-10-19 MED ORDER — PROMETHAZINE-DM 6.25-15 MG/5ML PO SYRP: 5.0000 mL | ORAL_SOLUTION | Freq: Four times a day (QID) | ORAL | 0 refills | Status: DC | PRN

## 2022-10-19 MED ORDER — AZELASTINE HCL 0.1 % NA SOLN: 2.0000 | Freq: Two times a day (BID) | NASAL | 1 refills | Status: DC

## 2022-10-19 MED ORDER — FLUNISOLIDE 25 MCG/ACT (0.025%) NA SOLN: 2.0000 | Freq: Two times a day (BID) | NASAL | 0 refills | Status: DC

## 2022-10-19 NOTE — Assessment & Plan Note (Signed)
Elevated A1c noted on prior labs, repeat labs ordered, lifestyle modifications encouraged.

## 2022-10-19 NOTE — Assessment & Plan Note (Signed)
Chronic, excellent control demonstrate today, she is without cardiopulmonary complaints and examination features are benign.  Continue current regimen, lifestyle modifications encouraged.

## 2022-10-19 NOTE — Patient Instructions (Signed)
-  Use new steroid nasal spray (if covered) - If not, use Astelin nasal spray - Continue Allegra - Use cough syrup as-needed - Obtain fasting labs - Return 04/2023 for physical

## 2022-10-19 NOTE — Assessment & Plan Note (Signed)
Prior diagnosis, encourage patient to establish with ENT for treatment options such as CPAP.

## 2022-10-19 NOTE — Assessment & Plan Note (Signed)
Prior respiratory tract infection symptoms have improved however have not resolved.  Primary concern for cough, occasionally productive, no significant shortness of air.  Symptoms are worse towards the evenings.  Examination with clear lung fields throughout, no wheezes, rales, rhonchi, oropharynx benign, nasopharynx with mild erythema, tympanic membrane on the left with mild fluid, no erythema, canal benign, contralateral canal and TM benign.  Patient most likely experiencing post infectious symptoms exacerbating her known allergic rhinitis, supportive care OTC regimen advised and as needed Rx antitussive Phenergan- DM sent.

## 2022-10-19 NOTE — Assessment & Plan Note (Signed)
Excellent control on current regimen despite recent major life stressors, no dose adjustments at this time after conversation with patient.

## 2022-10-19 NOTE — Progress Notes (Signed)
Primary Care / Sports Medicine Office Visit  Patient Information:  Patient ID: Jodi Conner, female DOB: 03/10/1986 Age: 37 y.o. MRN: [id number]   Jodi Conner is a pleasant 37 y.o. female presenting with the following:  Chief Complaint  Patient presents with   Hypertension   Cough    Still coughing, finished meds     Vitals:   10/19/22 0810  BP: 126/80  Pulse: 84  SpO2: 98%   Vitals:   10/19/22 0810  Weight: 231 lb (104.8 kg)  Height: 5\' 7"  (1.702 m)   Body mass index is 36.18 kg/m.     Independent interpretation of notes and tests performed by another provider:   None  Procedures performed:   None  Pertinent History, Exam, Impression, and Recommendations:   [name] was seen today for hypertension and cough.  Hypertension, unspecified type Assessment & Plan: Chronic, excellent control demonstrate today, she is without cardiopulmonary complaints and examination features are benign.  Continue current regimen, lifestyle modifications encouraged.  Orders: -     CBC -     Comprehensive metabolic panel -     Lipid panel -     Hemoglobin A1c  Elevated fasting glucose Assessment & Plan: Elevated A1c noted on prior labs, repeat labs ordered, lifestyle modifications encouraged.  Orders: -     Comprehensive metabolic panel -     Hemoglobin A1c  Anxiety and depression Overview:    10/19/2022    8:09 AM 10/05/2022    8:03 AM 07/17/2022    8:04 AM 04/16/2022    8:24 AM 04/14/2021    8:24 AM  Depression screen PHQ 2/9  Decreased Interest 0 0 0 1 1  Down, Depressed, Hopeless 0 0 0 1 1  PHQ - 2 Score 0 0 0 2 2  Altered sleeping 0 1 2 2 3   Tired, decreased energy 0 0 1 2 1   Change in appetite 0 0 0 2 1  Feeling bad or failure about yourself  0 0 0 0 0  Trouble concentrating 0 0 1 2 0  Moving slowly or fidgety/restless 0 0 0 2 0  Suicidal thoughts 0 0 0 0 0  PHQ-9 Score 0 1 4 12 7   Difficult doing work/chores Not difficult at all Not difficult at  all Not difficult at all Somewhat difficult Somewhat difficult      10/19/2022    8:09 AM 10/05/2022    8:03 AM 07/17/2022    8:04 AM 04/16/2022    8:24 AM  GAD 7 : Generalized Anxiety Score  Nervous, Anxious, on Edge 0 1 2 2   Control/stop worrying 0 1 2 2   Worry too much - different things 0 1 2 3   Trouble relaxing 0 0 1 2  Restless 0 0 0 2  Easily annoyed or irritable 0 0 1 2  Afraid - awful might happen 0 0 0 1  Total GAD 7 Score 0 3 8 14   Anxiety Difficulty Not difficult at all Not difficult at all Somewhat difficult Somewhat difficult      Assessment & Plan: Excellent control on current regimen despite recent major life stressors, no dose adjustments at this time after conversation with patient.   Mixed hyperlipidemia -     Comprehensive metabolic panel -     Lipid panel  OSA (obstructive sleep apnea) Assessment & Plan: Prior diagnosis, encourage patient to establish with ENT for treatment options such as CPAP.   Allergic rhinitis, unspecified seasonality, unspecified trigger  Assessment & Plan: Prior respiratory tract infection symptoms have improved however have not resolved.  Primary concern for cough, occasionally productive, no significant shortness of air.  Symptoms are worse towards the evenings.  Examination with clear lung fields throughout, no wheezes, rales, rhonchi, oropharynx benign, nasopharynx with mild erythema, tympanic membrane on the left with mild fluid, no erythema, canal benign, contralateral canal and TM benign.  Patient most likely experiencing post infectious symptoms exacerbating her known allergic rhinitis, supportive care OTC regimen advised and as needed Rx antitussive Phenergan- DM sent.  Orders: -     Azelastine HCl; Place 2 sprays into both nostrils 2 (two) times daily. Use in each nostril as directed  Dispense: 301 mL; Refill: 1 -     Promethazine-DM; Take 5 mLs by mouth 4 (four) times daily as needed for cough.  Dispense: 118 mL; Refill:  0 -     Flunisolide; Place 2 sprays into the nose 2 (two) times daily.  Dispense: 25 mL; Refill: 0     Orders & Medications Meds ordered this encounter  Medications   azelastine (ASTELIN) 0.1 % nasal spray    Sig: Place 2 sprays into both nostrils 2 (two) times daily. Use in each nostril as directed    Dispense:  301 mL    Refill:  1    Use generic Astelin   promethazine-dextromethorphan (PROMETHAZINE-DM) 6.25-15 MG/5ML syrup    Sig: Take 5 mLs by mouth 4 (four) times daily as needed for cough.    Dispense:  118 mL    Refill:  0   flunisolide (NASALIDE) 25 MCG/ACT (0.025%) SOLN    Sig: Place 2 sprays into the nose 2 (two) times daily.    Dispense:  25 mL    Refill:  0   Orders Placed This Encounter  Procedures   CBC   Comprehensive metabolic panel   Lipid panel   Hemoglobin A1c     Return for CPE.     [name], MD, [facility]   Primary Care Sports Medicine Primary Care and Sports Medicine at [facility]

## 2022-10-23 ENCOUNTER — Other Ambulatory Visit: Payer: Self-pay | Admitting: Family Medicine

## 2022-10-23 DIAGNOSIS — I10 Essential (primary) hypertension: Secondary | ICD-10-CM

## 2022-10-23 NOTE — Telephone Encounter (Signed)
Requested Prescriptions  Pending Prescriptions Disp Refills   hydrochlorothiazide (HYDRODIURIL) 25 MG tablet [Pharmacy Med Name: HYDROCHLOROTHIAZIDE 25 MG TAB] 90 tablet 1    Sig: TAKE 1 TABLET (25 MG TOTAL) BY MOUTH DAILY.     Cardiovascular: Diuretics - Thiazide Failed - 10/23/2022  1:36 AM      Failed - K in normal range and within 180 days    Potassium  Date Value Ref Range Status  09/23/2022 2.8 (L) 3.5 - 5.1 mmol/L Final         Passed - Cr in normal range and within 180 days    Creatinine, Ser  Date Value Ref Range Status  09/23/2022 0.81 0.44 - 1.00 mg/dL Final         Passed - Na in normal range and within 180 days    Sodium  Date Value Ref Range Status  09/23/2022 135 135 - 145 mmol/L Final  04/17/2022 138 134 - 144 mmol/L Final         Passed - Last BP in normal range    BP Readings from Last 1 Encounters:  10/19/22 126/80         Passed - Valid encounter within last 6 months    Recent Outpatient Visits           4 days ago Hypertension, unspecified type   [facility] Primary Care & Sports Medicine at [place], Earley Abide, MD   2 weeks ago Lower respiratory infection   [place] Primary Care & Sports Medicine at [place], Earley Abide, MD   3 months ago Hypertension, unspecified type   [facility] Primary [place] at [place], Earley Abide, MD   4 months ago Hypertension, unspecified type   [facility] Primary [place] at [place], Earley Abide, MD   6 months ago Annual physical exam   [place] at [facility], Earley Abide, MD       Future Appointments             In 6 months Zigmund Daniel, Earley Abide, MD [place] at [facility], [facility]

## 2022-10-28 ENCOUNTER — Other Ambulatory Visit: Payer: Self-pay | Admitting: Family Medicine

## 2022-10-28 DIAGNOSIS — F32A Depression, unspecified: Secondary | ICD-10-CM

## 2022-10-28 NOTE — Telephone Encounter (Signed)
Requested Prescriptions  Pending Prescriptions Disp Refills   sertraline (ZOLOFT) 25 MG tablet [Pharmacy Med Name: SERTRALINE HCL 25 MG TABLET] 90 tablet 1    Sig: TAKE 1 TABLET (25 MG TOTAL) BY MOUTH DAILY.     Psychiatry:  Antidepressants - SSRI - sertraline Passed - 10/28/2022  4:48 AM      Passed - AST in normal range and within 360 days    AST  Date Value Ref Range Status  04/17/2022 12 0 - 40 IU/L Final         Passed - ALT in normal range and within 360 days    ALT  Date Value Ref Range Status  04/17/2022 13 0 - 32 IU/L Final         Passed - Completed PHQ-2 or PHQ-9 in the last 360 days      Passed - Valid encounter within last 6 months    Recent Outpatient Visits           1 week ago Hypertension, unspecified type   [facility] Primary Care & Sports Medicine at [place], Earley Abide, MD   3 weeks ago Lower respiratory infection   [place] [facility] [place] at [place], Earley Abide, MD   3 months ago Hypertension, unspecified type   [facility] Primary [place] at [place], Earley Abide, MD   4 months ago Hypertension, unspecified type   [place] at [place], Earley Abide, MD   6 months ago Annual physical exam   [place] at [facility], Earley Abide, MD       Future Appointments             In 5 months Zigmund Daniel, Earley Abide, MD [place] at [facility], [facility]

## 2022-11-16 ENCOUNTER — Other Ambulatory Visit: Payer: Self-pay | Admitting: Family Medicine

## 2022-11-16 DIAGNOSIS — J309 Allergic rhinitis, unspecified: Secondary | ICD-10-CM

## 2022-11-17 NOTE — Telephone Encounter (Signed)
Requested Prescriptions  Pending Prescriptions Disp Refills   flunisolide (NASALIDE) 25 MCG/ACT (0.025%) SOLN [Pharmacy Med Name: FLUNISOLIDE 0.025% SPRAY] 25 mL 1    Sig: PLACE 2 SPRAYS INTO THE NOSE 2 (TWO) TIMES DAILY.     Ear, Nose, and Throat: Nasal Preparations - Corticosteroids Passed - 11/16/2022  1:40 AM      Passed - Valid encounter within last 12 months    Recent Outpatient Visits           4 weeks ago Hypertension, unspecified type   [facility] Primary Care & Sports Medicine at [place], Earley Abide, MD   1 month ago Lower respiratory infection   [place] Primary [place] at [place], Earley Abide, MD   4 months ago Hypertension, unspecified type   [facility] Primary Care & Sports Medicine at [place], Earley Abide, MD   5 months ago Hypertension, unspecified type   [place] at [place], Earley Abide, MD   7 months ago Annual physical exam   [place] at [facility], Earley Abide, MD       Future Appointments             In 5 months Zigmund Daniel, Earley Abide, MD [place] at [facility], [facility]

## 2022-12-06 ENCOUNTER — Other Ambulatory Visit: Payer: Self-pay | Admitting: Family Medicine

## 2022-12-06 DIAGNOSIS — J309 Allergic rhinitis, unspecified: Secondary | ICD-10-CM

## 2022-12-08 NOTE — Telephone Encounter (Signed)
Requested Prescriptions  Pending Prescriptions Disp Refills   Azelastine HCl 137 MCG/SPRAY SOLN [Pharmacy Med Name: AZELASTINE 0.1% (137 MCG) SPRY]  1    Sig: PLACE 2 SPRAYS INTO BOTH NOSTRILS 2 (TWO) TIMES DAILY. USE IN EACH NOSTRIL AS DIRECTED     Ear, Nose, and Throat: Nasal Preparations - Antiallergy Passed - 12/06/2022  8:47 AM      Passed - Valid encounter within last 12 months    Recent Outpatient Visits           1 month ago Hypertension, unspecified type   [facility] Primary Care & Sports Medicine at [place], Earley Abide, MD   2 months ago Lower respiratory infection   [place] Primary Care & Sports Medicine at [place], Earley Abide, MD   4 months ago Hypertension, unspecified type   [facility] Primary Care & Sports Medicine at [place], Earley Abide, MD   6 months ago Hypertension, unspecified type   [place] at [place], Earley Abide, MD   7 months ago Annual physical exam   [place] at [facility], Earley Abide, MD       Future Appointments             In 4 months Zigmund Daniel, Earley Abide, MD [place] at [facility], [facility]

## 2023-01-25 ENCOUNTER — Other Ambulatory Visit: Payer: Self-pay | Admitting: Family Medicine

## 2023-01-25 DIAGNOSIS — F419 Anxiety disorder, unspecified: Secondary | ICD-10-CM

## 2023-01-25 DIAGNOSIS — I10 Essential (primary) hypertension: Secondary | ICD-10-CM

## 2023-01-26 NOTE — Telephone Encounter (Signed)
Requested Prescriptions  Refused Prescriptions Disp Refills   sertraline (ZOLOFT) 25 MG tablet [Pharmacy Med Name: SERTRALINE HCL 25 MG TABLET] 90 tablet 1    Sig: TAKE 1 TABLET (25 MG TOTAL) BY MOUTH DAILY.     Psychiatry:  Antidepressants - SSRI - sertraline Passed - 01/25/2023 10:39 AM      Passed - AST in normal range and within 360 days    AST  Date Value Ref Range Status  04/17/2022 12 0 - 40 IU/L Final         Passed - ALT in normal range and within 360 days    ALT  Date Value Ref Range Status  04/17/2022 13 0 - 32 IU/L Final         Passed - Completed PHQ-2 or PHQ-9 in the last 360 days      Passed - Valid encounter within last 6 months    Recent Outpatient Visits           3 months ago Hypertension, unspecified type   [facility] Primary Care & Sports Medicine at [facility], Ocie Bob, MD   3 months ago Lower respiratory infection   [place] Primary Care & Sports Medicine at [facility], Ocie Bob, MD   6 months ago Hypertension, unspecified type   [facility] Primary Care & Sports Medicine at [facility], Ocie Bob, MD   7 months ago Hypertension, unspecified type   [facility] Primary Care & Sports Medicine at [facility], Ocie Bob, MD   9 months ago Annual physical exam   [facility] Primary Care & Sports Medicine at [facility], Ocie Bob, MD       Future Appointments             In 2 months Ashley Royalty, Ocie Bob, MD [facility] Primary Care & Sports Medicine at [facility], [facility]             hydrochlorothiazide (HYDRODIURIL) 25 MG tablet [Pharmacy Med Name: HYDROCHLOROTHIAZIDE 25 MG TAB] 90 tablet 1    Sig: TAKE 1 TABLET (25 MG TOTAL) BY MOUTH DAILY.     Cardiovascular: Diuretics - Thiazide Failed - 01/25/2023 10:39 AM      Failed - K in normal range and within 180 days    Potassium  Date Value Ref Range Status  09/23/2022 2.8 (L) 3.5 - 5.1 mmol/L Final         Passed - Cr in normal  range and within 180 days    Creatinine, Ser  Date Value Ref Range Status  09/23/2022 0.81 0.44 - 1.00 mg/dL Final         Passed - Na in normal range and within 180 days    Sodium  Date Value Ref Range Status  09/23/2022 135 135 - 145 mmol/L Final  04/17/2022 138 134 - 144 mmol/L Final         Passed - Last BP in normal range    BP Readings from Last 1 Encounters:  10/19/22 126/80         Passed - Valid encounter within last 6 months    Recent Outpatient Visits           3 months ago Hypertension, unspecified type   [facility] Primary Care & Sports Medicine at [facility], Ocie Bob, MD   3 months ago Lower respiratory infection   [facility] Primary Care & Sports Medicine at [facility], Ocie Bob,  MD   6 months ago Hypertension, unspecified type   [facility] Primary Care & Sports Medicine at [facility], Ocie Bob, MD   7 months ago Hypertension, unspecified type   [facility] Primary Care & Sports Medicine at [facility], Ocie Bob, MD   9 months ago Annual physical exam   [facility] Primary Care & Sports Medicine at [facility], Ocie Bob, MD       Future Appointments             In 2 months Ashley Royalty, Ocie Bob, MD [facility] Primary Care & Sports Medicine at [facility], [facility]

## 2023-02-19 ENCOUNTER — Other Ambulatory Visit: Payer: Self-pay | Admitting: Family Medicine

## 2023-02-19 DIAGNOSIS — I10 Essential (primary) hypertension: Secondary | ICD-10-CM

## 2023-02-19 DIAGNOSIS — F419 Anxiety disorder, unspecified: Secondary | ICD-10-CM

## 2023-02-22 NOTE — Telephone Encounter (Signed)
Requested Prescriptions  Refused Prescriptions Disp Refills   sertraline (ZOLOFT) 25 MG tablet [Pharmacy Med Name: SERTRALINE HCL 25 MG TABLET] 90 tablet 1    Sig: TAKE 1 TABLET (25 MG TOTAL) BY MOUTH DAILY.     Psychiatry:  Antidepressants - SSRI - sertraline Passed - 02/19/2023  8:03 PM      Passed - AST in normal range and within 360 days    AST  Date Value Ref Range Status  04/17/2022 12 0 - 40 IU/L Final         Passed - ALT in normal range and within 360 days    ALT  Date Value Ref Range Status  04/17/2022 13 0 - 32 IU/L Final         Passed - Completed PHQ-2 or PHQ-9 in the last 360 days      Passed - Valid encounter within last 6 months    Recent Outpatient Visits           4 months ago Hypertension, unspecified type   [facility] Primary Care & Sports Medicine at [facility], Ocie Bob, MD   4 months ago Lower respiratory infection   [place] Primary Care & Sports Medicine at [facility], Ocie Bob, MD   7 months ago Hypertension, unspecified type   [facility] Primary Care & Sports Medicine at [facility], Ocie Bob, MD   8 months ago Hypertension, unspecified type   [facility] Primary Care & Sports Medicine at [facility], Ocie Bob, MD   10 months ago Annual physical exam   [facility] Primary Care & Sports Medicine at [facility], Ocie Bob, MD       Future Appointments             In 1 month Ashley Royalty, Ocie Bob, MD [facility] Primary Care & Sports Medicine at [facility], [facility]             hydrochlorothiazide (HYDRODIURIL) 25 MG tablet [Pharmacy Med Name: HYDROCHLOROTHIAZIDE 25 MG TAB] 90 tablet 1    Sig: TAKE 1 TABLET (25 MG TOTAL) BY MOUTH DAILY.     Cardiovascular: Diuretics - Thiazide Failed - 02/19/2023  8:03 PM      Failed - K in normal range and within 180 days    Potassium  Date Value Ref Range Status  09/23/2022 2.8 (L) 3.5 - 5.1 mmol/L Final         Passed - Cr in normal  range and within 180 days    Creatinine, Ser  Date Value Ref Range Status  09/23/2022 0.81 0.44 - 1.00 mg/dL Final         Passed - Na in normal range and within 180 days    Sodium  Date Value Ref Range Status  09/23/2022 135 135 - 145 mmol/L Final  04/17/2022 138 134 - 144 mmol/L Final         Passed - Last BP in normal range    BP Readings from Last 1 Encounters:  10/19/22 126/80         Passed - Valid encounter within last 6 months    Recent Outpatient Visits           4 months ago Hypertension, unspecified type   [facility] Primary Care & Sports Medicine at [facility], Ocie Bob, MD   4 months ago Lower respiratory infection   [facility] Primary Care & Sports Medicine at [facility],  Ocie Bob, MD   7 months ago Hypertension, unspecified type   [facility] Primary Care & Sports Medicine at [facility], Ocie Bob, MD   8 months ago Hypertension, unspecified type   [facility] Primary Care & Sports Medicine at [facility], Ocie Bob, MD   10 months ago Annual physical exam   [facility] Primary Care & Sports Medicine at [facility], Ocie Bob, MD       Future Appointments             In 1 month Ashley Royalty, Ocie Bob, MD [facility] Primary Care & Sports Medicine at [facility], [facility]

## 2023-04-22 ENCOUNTER — Encounter: Payer: Self-pay | Admitting: Family Medicine

## 2023-04-22 ENCOUNTER — Ambulatory Visit (INDEPENDENT_AMBULATORY_CARE_PROVIDER_SITE_OTHER): Payer: No Typology Code available for payment source | Admitting: Family Medicine

## 2023-04-22 VITALS — BP 128/86 | HR 78 | Ht 67.0 in | Wt 230.0 lb

## 2023-04-22 DIAGNOSIS — Z0001 Encounter for general adult medical examination with abnormal findings: Secondary | ICD-10-CM

## 2023-04-22 DIAGNOSIS — Z0000 Encounter for general adult medical examination without abnormal findings: Secondary | ICD-10-CM

## 2023-04-22 DIAGNOSIS — L309 Dermatitis, unspecified: Secondary | ICD-10-CM

## 2023-04-22 DIAGNOSIS — R7303 Prediabetes: Secondary | ICD-10-CM | POA: Diagnosis not present

## 2023-04-22 DIAGNOSIS — M7541 Impingement syndrome of right shoulder: Secondary | ICD-10-CM

## 2023-04-22 DIAGNOSIS — D508 Other iron deficiency anemias: Secondary | ICD-10-CM

## 2023-04-22 DIAGNOSIS — I10 Essential (primary) hypertension: Secondary | ICD-10-CM

## 2023-04-22 DIAGNOSIS — Z131 Encounter for screening for diabetes mellitus: Secondary | ICD-10-CM

## 2023-04-22 DIAGNOSIS — F419 Anxiety disorder, unspecified: Secondary | ICD-10-CM | POA: Diagnosis not present

## 2023-04-22 DIAGNOSIS — R7301 Impaired fasting glucose: Secondary | ICD-10-CM

## 2023-04-22 DIAGNOSIS — E782 Mixed hyperlipidemia: Secondary | ICD-10-CM

## 2023-04-22 DIAGNOSIS — O039 Complete or unspecified spontaneous abortion without complication: Secondary | ICD-10-CM

## 2023-04-22 DIAGNOSIS — E559 Vitamin D deficiency, unspecified: Secondary | ICD-10-CM

## 2023-04-22 DIAGNOSIS — F32A Depression, unspecified: Secondary | ICD-10-CM

## 2023-04-22 MED ORDER — HYDROCHLOROTHIAZIDE 25 MG PO TABS: 25.0000 mg | ORAL_TABLET | Freq: Every day | ORAL | 3 refills | Status: DC

## 2023-04-22 MED ORDER — MELOXICAM 15 MG PO TABS
15.0000 mg | ORAL_TABLET | Freq: Every day | ORAL | 0 refills | Status: DC | PRN
Start: 2023-04-22 — End: 2023-05-26

## 2023-04-22 MED ORDER — SERTRALINE HCL 25 MG PO TABS: 25.0000 mg | ORAL_TABLET | Freq: Every day | ORAL | 3 refills | Status: DC

## 2023-04-22 NOTE — Patient Instructions (Signed)
-   Obtain fasting labs with orders provided (can have water or black coffee but otherwise no food or drink x 8 hours before labs) - Review information provided - Attend eye doctor annually, dentist every 6 months, work towards or maintain 30 minutes of moderate intensity physical activity at least 5 days per week, and consume a balanced diet - Return in 1 year for physical - Contact us for any questions between now and then  Additionally: - Check in with GYN about miscarriage testing as indicated - Take meloxicam daily with food x 7 days then daily as-needed for shoulder pain - Start home exercises - Contact us for any persistent, unimproved symptoms at 4-6 weeks

## 2023-04-23 ENCOUNTER — Encounter: Payer: Self-pay | Admitting: Family Medicine

## 2023-04-23 ENCOUNTER — Other Ambulatory Visit: Payer: Self-pay | Admitting: Family Medicine

## 2023-04-23 DIAGNOSIS — M7541 Impingement syndrome of right shoulder: Secondary | ICD-10-CM | POA: Insufficient documentation

## 2023-04-23 DIAGNOSIS — E559 Vitamin D deficiency, unspecified: Secondary | ICD-10-CM

## 2023-04-23 DIAGNOSIS — O039 Complete or unspecified spontaneous abortion without complication: Secondary | ICD-10-CM | POA: Insufficient documentation

## 2023-04-23 MED ORDER — VITAMIN D (ERGOCALCIFEROL) 1.25 MG (50000 UNIT) PO CAPS
50000.0000 [IU] | ORAL_CAPSULE | ORAL | 0 refills | Status: DC
Start: 2023-04-23 — End: 2024-05-23

## 2023-04-23 NOTE — Assessment & Plan Note (Signed)
Demonstrating excellent control, asymptomatic.

## 2023-04-23 NOTE — Progress Notes (Signed)
Primary Care / Sports Medicine Office Visit  Patient Information:  Patient ID: Jodi Conner, female DOB: 14-Jul-1986 Age: 37 y.o. MRN: [id number]   Jodi Conner is a pleasant 37 y.o. female presenting with the following:  Chief Complaint  Patient presents with   Annual Exam    Vitals:   04/22/23 0801  BP: 128/86  Pulse: 78  SpO2: 98%   Vitals:   04/22/23 0801  Weight: 230 lb (104.3 kg)  Height: 5\' 7"  (1.702 m)   Body mass index is 36.02 kg/m.  No results found.   Independent interpretation of notes and tests performed by another provider:   None  Procedures performed:   None  Pertinent History, Exam, Impression, and Recommendations:   Jodi Conner was seen today for annual exam.  Healthcare maintenance Assessment & Plan: Annual examination completed, risk stratification labs ordered, anticipatory guidance provided.  We will follow labs once resulted.  Orders: -     TSH -     VITAMIN D 25 Hydroxy (Vit-D Deficiency, Fractures) -     Lipid panel -     Comprehensive metabolic panel -     CBC -     Iron, TIBC and Ferritin Panel -     Hemoglobin A1c  Hypertension, unspecified type Assessment & Plan: Demonstrating excellent control, asymptomatic.  Orders: -     hydroCHLOROthiazide; Take 1 tablet (25 mg total) by mouth daily.  Dispense: 90 tablet; Refill: 3  Anxiety and depression Assessment & Plan: Doing well on current regimen, discussed possible titration though, given excellent scores, continue sertraline 25 mg daily.  Orders: -     Sertraline HCl; Take 1 tablet (25 mg total) by mouth daily.  Dispense: 90 tablet; Refill: 3  Prediabetes -     Hemoglobin A1c  Screening for diabetes mellitus -     Hemoglobin A1c  Mixed hyperlipidemia -     Lipid panel -     Comprehensive metabolic panel  Elevated fasting glucose -     Hemoglobin A1c  Other iron deficiency anemia -     CBC -     Iron, TIBC and Ferritin Panel  Annual physical  exam Assessment & Plan: Annual examination completed, risk stratification labs ordered, anticipatory guidance provided.  We will follow labs once resulted.  Orders: -     TSH -     VITAMIN D 25 Hydroxy (Vit-D Deficiency, Fractures) -     Lipid panel -     Comprehensive metabolic panel -     CBC -     Iron, TIBC and Ferritin Panel -     Hemoglobin A1c  Vitamin D deficiency -     VITAMIN D 25 Hydroxy (Vit-D Deficiency, Fractures)  Rotator cuff impingement syndrome, right Assessment & Plan: Patient has noted right shoulder pain ongoing for several weeks, interferes with ADLs.  Exam localizes about the supraspinatus where there is weakness, positive impingement.  Reviewed treatment strategies, plan as follows:  - Meloxicam x 7 days then daily as needed - Start home-based rehab program - Patient to contact us for any persistent symptoms at the 4 to 6-week mark, at that time consider imaging, subacromial corticosteroid injection, formal PT, among other management options  Orders: -     Meloxicam; Take 1 tablet (15 mg total) by mouth daily as needed for pain.  Dispense: 30 tablet; Refill: 0  Dermatitis of right foot Assessment & Plan: Has been evaluated by podiatry, topical Rx management when symptoms flare.  Miscarriage Assessment & Plan: Patient unfortunately experienced a miscarriage earlier this year, 2024.  Received RhoGAM.  Advised patient to touch base with OB/GYN regarding workup for underlying etiologies if clinically indicated.      Orders & Medications Meds ordered this encounter  Medications   hydrochlorothiazide (HYDRODIURIL) 25 MG tablet    Sig: Take 1 tablet (25 mg total) by mouth daily.    Dispense:  90 tablet    Refill:  3   sertraline (ZOLOFT) 25 MG tablet    Sig: Take 1 tablet (25 mg total) by mouth daily.    Dispense:  90 tablet    Refill:  3   meloxicam (MOBIC) 15 MG tablet    Sig: Take 1 tablet (15 mg total) by mouth daily as needed for pain.     Dispense:  30 tablet    Refill:  0   Orders Placed This Encounter  Procedures   TSH   VITAMIN D 25 Hydroxy (Vit-D Deficiency, Fractures)   Lipid panel   Comprehensive metabolic panel   CBC   Iron, TIBC and Ferritin Panel   Hemoglobin A1c     No follow-ups on file.     Jerrol Banana, MD, [facility]   Primary Care Sports Medicine Primary Care and Sports Medicine at [facility]

## 2023-04-23 NOTE — Assessment & Plan Note (Signed)
Patient unfortunately experienced a miscarriage earlier this year, 2024.  Received RhoGAM.  Advised patient to touch base with OB/GYN regarding workup for underlying etiologies if clinically indicated.

## 2023-04-23 NOTE — Assessment & Plan Note (Signed)
Has been evaluated by podiatry, topical Rx management when symptoms flare.

## 2023-04-23 NOTE — Assessment & Plan Note (Signed)
Patient has noted right shoulder pain ongoing for several weeks, interferes with ADLs.  Exam localizes about the supraspinatus where there is weakness, positive impingement.  Reviewed treatment strategies, plan as follows:  - Meloxicam x 7 days then daily as needed - Start home-based rehab program - Patient to contact us for any persistent symptoms at the 4 to 6-week mark, at that time consider imaging, subacromial corticosteroid injection, formal PT, among other management options

## 2023-04-23 NOTE — Assessment & Plan Note (Signed)
Doing well on current regimen, discussed possible titration though, given excellent scores, continue sertraline 25 mg daily.

## 2023-04-23 NOTE — Assessment & Plan Note (Signed)
Annual examination completed, risk stratification labs ordered, anticipatory guidance provided.  We will follow labs once resulted. 

## 2023-05-13 ENCOUNTER — Other Ambulatory Visit: Payer: Self-pay | Admitting: Family Medicine

## 2023-05-13 DIAGNOSIS — E559 Vitamin D deficiency, unspecified: Secondary | ICD-10-CM

## 2023-05-14 NOTE — Telephone Encounter (Signed)
Requested medication (s) are due for refill today:  Provider to review  Requested medication (s) are on the active medication list:   Yes  Future visit scheduled:   Yes 2/3   Last ordered: 04/23/2023 #8, 0 refills  Returned because a 90 day supply and a DX Code are being requested.   Requested Prescriptions  Pending Prescriptions Disp Refills   Vitamin D, Ergocalciferol, (DRISDOL) 1.25 MG (50000 UNIT) CAPS capsule [Pharmacy Med Name: VITAMIN D2 1.25MG (50,000 UNIT)] 12 capsule 1    Sig: Take 1 capsule (50,000 Units total) by mouth every 7 (seven) days. Take for 8 total doses(weeks)     Endocrinology:  Vitamins - Vitamin D Supplementation 2 Failed - 05/13/2023  9:31 AM      Failed - Manual Review: Route requests for 50,000 IU strength to the provider      Failed - Vitamin D in normal range and within 360 days    Vit D, 25-Hydroxy  Date Value Ref Range Status  04/22/2023 26.2 (L) 30.0 - 100.0 ng/mL Final    Comment:    Vitamin D deficiency has been defined by the Institute of Medicine and an Endocrine Society practice guideline as a level of serum 25-OH vitamin D less than 20 ng/mL (1,2). The Endocrine Society went on to further define vitamin D insufficiency as a level between 21 and 29 ng/mL (2). 1. IOM (Institute of Medicine). 2010. Dietary reference    intakes for calcium and D. [place]: The    [employer]. 2. Holick MF, Binkley Hope, Bischoff-Ferrari HA, et al.    Evaluation, treatment, and prevention of vitamin D    deficiency: an Endocrine Society clinical practice    guideline. JCEM. 2011 Jul; 96(7):1911-30.          Passed - Ca in normal range and within 360 days    Calcium  Date Value Ref Range Status  04/22/2023 9.5 8.7 - 10.2 mg/dL Final         Passed - Valid encounter within last 12 months    Recent Outpatient Visits           3 weeks ago Healthcare maintenance   [facility] Primary Care & Sports Medicine at [facility], Ocie Bob, MD   6 months ago Hypertension, unspecified type   [facility] Primary Care & Sports Medicine at [facility], Ocie Bob, MD   7 months ago Lower respiratory infection   [facility] Primary Care & Sports Medicine at [facility], Ocie Bob, MD   10 months ago Hypertension, unspecified type   [facility] Primary Care & Sports Medicine at [facility], Ocie Bob, MD   11 months ago Hypertension, unspecified type   [facility] Primary Care & Sports Medicine at [facility], Ocie Bob, MD       Future Appointments             In 5 months Ashley Royalty, Ocie Bob, MD [facility] Health Primary Care & Sports Medicine at [facility], [facility]

## 2023-05-20 ENCOUNTER — Other Ambulatory Visit: Payer: Self-pay | Admitting: Family Medicine

## 2023-05-20 DIAGNOSIS — M7541 Impingement syndrome of right shoulder: Secondary | ICD-10-CM

## 2023-05-20 NOTE — Telephone Encounter (Signed)
Requested medication (s) are due for refill today: yes  Requested medication (s) are on the active medication list: yes  Last refill:  04/22/23 #30 0 refills  Future visit scheduled: yes in 5 months   Notes to clinic:   no refills remain. Do you want to continue refills?     Requested Prescriptions  Pending Prescriptions Disp Refills   meloxicam (MOBIC) 15 MG tablet [Pharmacy Med Name: MELOXICAM 15 MG TABLET] 30 tablet 0    Sig: TAKE 1 TABLET BY MOUTH EVERY DAY AS NEEDED FOR PAIN     Analgesics:  COX2 Inhibitors Failed - 05/20/2023  1:36 AM      Failed - Manual Review: Labs are only required if the patient has taken medication for more than 8 weeks.      Passed - HGB in normal range and within 360 days    Hemoglobin  Date Value Ref Range Status  04/22/2023 12.8 11.1 - 15.9 g/dL Final         Passed - Cr in normal range and within 360 days    Creatinine, Ser  Date Value Ref Range Status  04/22/2023 0.84 0.57 - 1.00 mg/dL Final         Passed - HCT in normal range and within 360 days    Hematocrit  Date Value Ref Range Status  04/22/2023 38.3 34.0 - 46.6 % Final         Passed - AST in normal range and within 360 days    AST  Date Value Ref Range Status  04/22/2023 9 0 - 40 IU/L Final         Passed - ALT in normal range and within 360 days    ALT  Date Value Ref Range Status  04/22/2023 9 0 - 32 IU/L Final         Passed - eGFR is 30 or above and within 360 days    GFR, Estimated  Date Value Ref Range Status  09/23/2022 >60 >60 mL/min Final    Comment:    (NOTE) Calculated using the CKD-EPI Creatinine Equation (2021)    eGFR  Date Value Ref Range Status  04/22/2023 92 >59 mL/min/1.73 Final         Passed - Patient is not pregnant      Passed - Valid encounter within last 12 months    Recent Outpatient Visits           4 weeks ago Healthcare maintenance   [facility] Primary Care & Sports Medicine at [facility], Ocie Bob, MD   7 months  ago Hypertension, unspecified type   [facility] Primary Care & Sports Medicine at [facility], Ocie Bob, MD   7 months ago Lower respiratory infection   [facility] Health Primary Care & Sports Medicine at [facility], Ocie Bob, MD   10 months ago Hypertension, unspecified type   [facility] Health Primary Care & Sports Medicine at [facility], Ocie Bob, MD   11 months ago Hypertension, unspecified type   [facility] Health Primary Care & Sports Medicine at [facility], Ocie Bob, MD       Future Appointments             In 5 months Ashley Royalty, Ocie Bob, MD [facility] Health Primary Care & Sports Medicine at [facility], [facility]

## 2023-05-21 NOTE — Telephone Encounter (Signed)
Please review.  KP

## 2023-10-14 ENCOUNTER — Other Ambulatory Visit: Payer: Self-pay | Admitting: Advanced Practice Midwife

## 2023-10-14 DIAGNOSIS — Z3041 Encounter for surveillance of contraceptive pills: Secondary | ICD-10-CM

## 2023-10-25 ENCOUNTER — Ambulatory Visit: Payer: No Typology Code available for payment source | Admitting: Family Medicine

## 2023-11-04 ENCOUNTER — Ambulatory Visit: Payer: Self-pay | Admitting: Family Medicine

## 2024-01-14 ENCOUNTER — Ambulatory Visit (INDEPENDENT_AMBULATORY_CARE_PROVIDER_SITE_OTHER): Admitting: Advanced Practice Midwife

## 2024-01-14 ENCOUNTER — Encounter: Payer: Self-pay | Admitting: Advanced Practice Midwife

## 2024-01-14 ENCOUNTER — Other Ambulatory Visit (HOSPITAL_COMMUNITY)
Admission: RE | Admit: 2024-01-14 | Discharge: 2024-01-14 | Disposition: A | Discharge status: Home / Self Care | Source: Ambulatory Visit | Attending: Advanced Practice Midwife | Admitting: Advanced Practice Midwife

## 2024-01-14 VITALS — BP 137/109 | HR 102 | Ht 67.0 in | Wt 244.0 lb

## 2024-01-14 DIAGNOSIS — Z113 Encounter for screening for infections with a predominantly sexual mode of transmission: Secondary | ICD-10-CM | POA: Insufficient documentation

## 2024-01-14 DIAGNOSIS — Z01419 Encounter for gynecological examination (general) (routine) without abnormal findings: Secondary | ICD-10-CM | POA: Diagnosis present

## 2024-01-14 DIAGNOSIS — Z3041 Encounter for surveillance of contraceptive pills: Secondary | ICD-10-CM

## 2024-01-14 MED ORDER — LO LOESTRIN FE 1 MG-10 MCG / 10 MCG PO TABS: 1.0000 | ORAL_TABLET | Freq: Every day | ORAL | 12 refills | Status: AC

## 2024-01-14 NOTE — Progress Notes (Signed)
 [facility]   Gynecology Annual Exam   PCP: Ma Saupe, MD  Chief Complaint:  Chief Complaint  Patient presents with   Annual Exam    Needs [facility] refilled wants to discuss her miscarriage from last year. Will do std testing.    History of Present Illness: Patient is a 38 y.o. G1P0010 presents for annual exam. The patient has no gyn complaints today.   LMP: Patient's last menstrual period was 01/08/2024. Average Interval: regular, 28 days Duration of flow:  5-7  days Heavy Menses: varies Clots: no Intermenstrual Bleeding: no Postcoital Bleeding: no Dysmenorrhea: no  The patient is sexually active. She currently uses OCP (estrogen/progesterone) for contraception. She denies dyspareunia.  The patient does perform self breast exams.  There is notable family history of breast or ovarian cancer in her family. Maternal grandmother with ovarian cancer. Patient accepts [facility] testing today.  The patient wears seatbelts: yes.   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    The patient denies current symptoms of depression.    Review of Systems: Review of Systems  Constitutional:  Negative for chills and fever.  HENT:  Negative for congestion, ear discharge, ear pain, hearing loss, sinus pain and sore throat.   Eyes:  Negative for blurred vision and double vision.  Respiratory:  Negative for cough, shortness of breath and wheezing.   Cardiovascular:  Negative for chest pain, palpitations and leg swelling.  Gastrointestinal:  Negative for abdominal pain, blood in stool, constipation, diarrhea, heartburn, melena, nausea and vomiting.  Genitourinary:  Negative for dysuria, flank pain, frequency, hematuria and urgency.  Musculoskeletal:  Negative for back pain, joint pain and myalgias.  Skin:  Negative for itching and rash.   Neurological:  Negative for dizziness, tingling, tremors, sensory change, speech change, focal weakness, seizures, loss of consciousness, weakness and headaches.  Endo/Heme/Allergies:  Positive for environmental allergies. Does not bruise/bleed easily.  Psychiatric/Behavioral:  Negative for depression, hallucinations, memory loss, substance abuse and suicidal ideas. The patient is not nervous/anxious and does not have insomnia.        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    Menorrhagia 10/05/2022    Cervical intraepithelial neoplasia grade 1 10/05/2022     H/O    Supervision of other normal pregnancy, antepartum 09/04/2022      Clinical Staff Provider  Office Location  [facility] Dating  04/23/2023, by Last Menstrual Period  Language  English Anatomy US     Flu Vaccine  offer Genetic Screen  NIPS:   TDaP vaccine   offer Hgb A1C or  GTT Early : Third trimester :   Covid No boosters   LAB RESULTS   Rhogam     Blood Type     Feeding Plan breast Antibody    Contraception undecided Rubella    Circumcision yes RPR     Pediatrician  undecided HBsAg     Support Person Leighton Punches HIV Non Reactive (07/28 1106)  Prenatal Classes yes Varicella     GBS  (For PCN allergy, check sensitivities)   BTL Consent  Hep C  VBAC Consent  Pap Diagnosis  Date Value Ref Range Status  04/30/2021   Final   - Negative for intraepithelial lesion or malignancy (NILM)      Hgb Electro      CF      SMA             Prediabetes 07/17/2022    Hypertension 06/05/2022    Iron  deficiency anemia 05/04/2022    Mixed hyperlipidemia 04/16/2022    Dermatitis of right foot 04/16/2022    Elevated fasting glucose 04/16/2022    Healthcare maintenance 04/14/2021     Allergic rhinitis 04/14/2021    Anxiety and depression 04/08/2021    OSA (obstructive sleep apnea) 04/08/2021     Past Surgical History:  History reviewed. No pertinent surgical history.  Gynecologic History:  Patient's last menstrual period was 01/08/2024. Contraception: OCP (estrogen/progesterone) Last Pap: 2022 Results were: no abnormalities   Obstetric History: G1P0010  Family History:  Family History  Problem Relation Age of Onset   Alcohol abuse Mother    Drug abuse Mother    Alcohol abuse Father    Healthy Sister    Healthy Sister    Healthy Brother    Cervical cancer Maternal Grandmother    Cancer Maternal Grandmother    Healthy Maternal Grandfather    Healthy Paternal Grandmother    Healthy Paternal Grandfather    Cancer Maternal Aunt     Social History:  Social History   Socioeconomic History   Marital status: Single    Spouse name: Not on file   Number of children: 0   Years of education: 16   Highest education level: Bachelor's degree (e.g., BA, AB, BS)  Occupational History   Occupation: Network engineer  Tobacco Use   Smoking status: Former    Types: Cigars   Smokeless tobacco: Never  Vaping Use   Vaping status: Never Used  Substance and Sexual Activity   Alcohol use: Yes    Alcohol/week: 2.0 standard drinks of alcohol    Types: 2 Standard drinks or equivalent per week   Drug use: Never   Sexual activity: Yes    Partners: Male    Birth control/protection: Pill, None  Other Topics Concern   Not on file  Social History Narrative   Not on file   Social Drivers of Health   Financial Resource Strain: Low Risk  (09/04/2022)   Overall Financial Resource Strain (CARDIA)    Difficulty of Paying Living Expenses: Not hard at all  Food Insecurity: No Food Insecurity (09/04/2022)   Hunger Vital Sign    Worried About Running Out of Food in the Last Year: Never true    Ran Out of Food in the Last Year: Never true  Transportation Needs:  No Transportation Needs (09/04/2022)   PRAPARE - Administrator, Civil Service (Medical): No    Lack of Transportation (Non-Medical): No  Physical Activity: Insufficiently Active (09/04/2022)   Exercise Vital Sign    Days of Exercise per Week: 3 days    Minutes of Exercise per Session: 20 min  Stress: Stress Concern Present (09/04/2022)   [employer] of Occupational Health - Occupational Stress Questionnaire    Feeling of Stress : To some extent  Social Connections: Unknown (09/04/2022)   Social Connection and Isolation Panel [NHANES]    Frequency of Communication with Friends and Family: More than three times a week    Frequency of Social Gatherings with Friends and Family: Twice a week  Attends Religious Services: Never    Active Member of Clubs or Organizations: No    Attends Banker Meetings: Never    Marital Status: Not on file  Intimate Partner Violence: Not At Risk (09/04/2022)   Humiliation, Afraid, Rape, and Kick questionnaire    Fear of Current or Ex-Partner: No    Emotionally Abused: No    Physically Abused: No    Sexually Abused: No    Allergies:  No Known Allergies  Medications: Prior to Admission medications   Medication Sig Start Date End Date Taking? Authorizing Provider  esomeprazole (NEXIUM) 20 MG capsule Take 20 mg by mouth daily at 12 noon.   Yes [provider]  fexofenadine (ALLEGRA) 180 MG tablet Take 180 mg by mouth daily.   Yes [provider]  flunisolide  (NASALIDE ) 25 MCG/ACT (0.025%) SOLN PLACE 2 SPRAYS INTO THE NOSE 2 (TWO) TIMES DAILY. 11/17/22  Yes Ma Saupe, MD  hydrochlorothiazide  (HYDRODIURIL ) 25 MG tablet Take 1 tablet (25 mg total) by mouth daily. 04/22/23  Yes Ma Saupe, MD  meloxicam  (MOBIC ) 15 MG tablet TAKE 1 TABLET BY MOUTH EVERY DAY AS NEEDED FOR PAIN 05/26/23  Yes Matthews, Jason J, MD  mometasone (NASONEX) 50 MCG/ACT nasal spray Place 2 sprays into the nose daily.   Yes  [provider]  Multiple Vitamin (MULTI VITAMIN DAILY PO) daily.   Yes [provider]  sertraline  (ZOLOFT ) 25 MG tablet Take 1 tablet (25 mg total) by mouth daily. 04/22/23  Yes Ma Saupe, MD  Vitamin D , Ergocalciferol , (DRISDOL ) 1.25 MG (50000 UNIT) CAPS capsule Take 1 capsule (50,000 Units total) by mouth every 7 (seven) days. Take for 8 total doses(weeks) 04/23/23  Yes Matthews, Jason J, MD  Norethindrone-Ethinyl Estradiol -Fe Biphas (LO LOESTRIN FE) 1 MG-10 MCG / 10 MCG tablet Take 1 tablet by mouth daily. 01/14/24   Angelita Kendall, CNM    Physical Exam Vitals: Blood pressure (!) 137/109, pulse (!) 102, height 5\' 7"  (1.702 m), weight 244 lb (110.7 kg), last menstrual period 01/08/2024, unknown if currently breastfeeding.  General: NAD HEENT: normocephalic, anicteric Thyroid: no enlargement, no palpable nodules Pulmonary: No increased work of breathing, CTAB Cardiovascular: RRR, distal pulses 2+ Breast: Breast symmetrical, no tenderness, no palpable nodules or masses, no skin or nipple retraction present, no nipple discharge.  No axillary or supraclavicular lymphadenopathy. Abdomen: NABS, soft, non-tender, non-distended.  Umbilicus without lesions.  No hepatomegaly, splenomegaly or masses palpable. No evidence of hernia  Genitourinary:  External: Normal external female genitalia.  Normal urethral meatus, normal Bartholin's and Skene's glands.    Vagina: Normal vaginal mucosa, no evidence of prolapse.    Cervix: Grossly normal in appearance, no bleeding  Uterus: Non-enlarged, mobile, normal contour.  No CMT  Adnexa: ovaries non-enlarged, no adnexal masses  Rectal: deferred  Lymphatic: no evidence of inguinal lymphadenopathy Extremities: no edema, erythema, or tenderness Neurologic: Grossly intact Psychiatric: mood appropriate, affect full   Assessment: 38 y.o. G1P0010 routine annual exam  Plan: Problem List Items Addressed This Visit   None Visit Diagnoses        Well woman exam with routine gynecological exam    -  Primary   Relevant Medications   Norethindrone-Ethinyl Estradiol -Fe Biphas (LO LOESTRIN FE) 1 MG-10 MCG / 10 MCG tablet   Other Relevant Orders   Cervicovaginal ancillary only   Hepatitis B surface antibody,qualitative   Hepatitis C antibody   HIV Antibody (routine testing w rflx)   RPR Qual  Routine screening for STI (sexually transmitted infection)       Relevant Orders   Cervicovaginal ancillary only   Hepatitis B surface antibody,qualitative   Hepatitis C antibody   HIV Antibody (routine testing w rflx)   RPR Qual     Encounter for surveillance of contraceptive pills       Relevant Medications   Norethindrone-Ethinyl Estradiol -Fe Biphas (LO LOESTRIN FE) 1 MG-10 MCG / 10 MCG tablet       1) STI screening  was offered and accepted  2)  ASCCP guidelines and rationale discussed.  Patient opts for every 5 years screening interval  3) Contraception - the patient is currently using  OCP (estrogen/progesterone).  She is happy with her current form of contraception and plans to continue  4) Routine healthcare maintenance including cholesterol, diabetes screening discussed managed by PCP  5) Increase healthy lifestyle; sleep, focus on healthy calories  6) Return in about 1 year (around 01/13/2025) for annual.   Angelita Kendall, CNM [facility] 01/14/2024 3:57 PM

## 2024-01-14 NOTE — Patient Instructions (Signed)

## 2024-01-15 LAB — HEPATITIS C ANTIBODY: Hep C Virus Ab: NONREACTIVE

## 2024-01-15 LAB — HIV ANTIBODY (ROUTINE TESTING W REFLEX): HIV Screen 4th Generation wRfx: NONREACTIVE

## 2024-01-15 LAB — RPR QUALITATIVE: RPR Ser Ql: NONREACTIVE

## 2024-01-15 LAB — HEPATITIS B SURFACE ANTIBODY,QUALITATIVE: Hep B Surface Ab, Qual: REACTIVE

## 2024-01-17 ENCOUNTER — Encounter: Payer: Self-pay | Admitting: Advanced Practice Midwife

## 2024-01-18 LAB — CERVICOVAGINAL ANCILLARY ONLY
Chlamydia: NEGATIVE
Comment: NEGATIVE
Comment: NEGATIVE
Comment: NORMAL
Neisseria Gonorrhea: NEGATIVE
Trichomonas: NEGATIVE

## 2024-01-20 DIAGNOSIS — Z1371 Encounter for nonprocreative screening for genetic disease carrier status: Secondary | ICD-10-CM

## 2024-01-20 HISTORY — DX: Encounter for nonprocreative screening for genetic disease carrier status: Z13.71

## 2024-02-08 ENCOUNTER — Encounter: Payer: Self-pay | Admitting: Obstetrics and Gynecology

## 2024-04-21 ENCOUNTER — Other Ambulatory Visit: Payer: Self-pay | Admitting: Family Medicine

## 2024-04-21 DIAGNOSIS — F32A Depression, unspecified: Secondary | ICD-10-CM

## 2024-04-21 DIAGNOSIS — I10 Essential (primary) hypertension: Secondary | ICD-10-CM

## 2024-04-21 NOTE — Telephone Encounter (Signed)
 Unable to refill per protocol, appointment needed.   Requested Prescriptions  Pending Prescriptions Disp Refills   sertraline  (ZOLOFT ) 25 MG tablet [Pharmacy Med Name: SERTRALINE  HCL 25 MG TABLET] 90 tablet 3    Sig: TAKE 1 TABLET (25 MG TOTAL) BY MOUTH DAILY.     Psychiatry:  Antidepressants - SSRI - sertraline  Failed - 04/21/2024  2:23 PM      Failed - AST in normal range and within 360 days    AST  Date Value Ref Range Status  04/22/2023 9 0 - 40 IU/L Final         Failed - ALT in normal range and within 360 days    ALT  Date Value Ref Range Status  04/22/2023 9 0 - 32 IU/L Final         Failed - Completed PHQ-2 or PHQ-9 in the last 360 days      Failed - Valid encounter within last 6 months    Recent Outpatient Visits   None             hydrochlorothiazide  (HYDRODIURIL ) 25 MG tablet [Pharmacy Med Name: HYDROCHLOROTHIAZIDE  25 MG TAB] 90 tablet 3    Sig: TAKE 1 TABLET (25 MG TOTAL) BY MOUTH DAILY.     Cardiovascular: Diuretics - Thiazide Failed - 04/21/2024  2:23 PM      Failed - Cr in normal range and within 180 days    Creatinine, Ser  Date Value Ref Range Status  04/22/2023 0.84 0.57 - 1.00 mg/dL Final         Failed - K in normal range and within 180 days    Potassium  Date Value Ref Range Status  04/22/2023 3.7 3.5 - 5.2 mmol/L Final         Failed - Na in normal range and within 180 days    Sodium  Date Value Ref Range Status  04/22/2023 138 134 - 144 mmol/L Final         Failed - Last BP in normal range    BP Readings from Last 1 Encounters:  01/14/24 (!) 137/109         Failed - Valid encounter within last 6 months    Recent Outpatient Visits   None

## 2024-04-25 ENCOUNTER — Other Ambulatory Visit: Payer: Self-pay | Admitting: Family Medicine

## 2024-04-25 DIAGNOSIS — I10 Essential (primary) hypertension: Secondary | ICD-10-CM

## 2024-04-25 DIAGNOSIS — F419 Anxiety disorder, unspecified: Secondary | ICD-10-CM

## 2024-04-25 NOTE — Telephone Encounter (Unsigned)
 Copied from CRM [id number]. Topic: Clinical - Medication Refill >> Apr 25, 2024  8:52 AM Tiffany S wrote: Medication: sertraline  (ZOLOFT ) 25 MG tablet [[id number] hydrochlorothiazide  (HYDRODIURIL ) 25 MG tablet [[id number]]  Has the patient contacted their pharmacy? Yes (Agent: If no, request that the patient contact the pharmacy for the refill. If patient does not wish to contact the pharmacy document the reason why and proceed with request.) (Agent: If yes, when and what did the pharmacy advise?)  This is the patient's preferred pharmacy:  [facility] [phone] [address], [place] - [address] [place] [address] Phone: [phone] Fax: [phone]  Is this the correct pharmacy for this prescription? Yes If no, delete pharmacy and type the correct one.   Has the prescription been filled recently? Yes  Is the patient out of the medication? Yes  Has the patient been seen for an appointment in the last year OR does the patient have an upcoming appointment? Yes  Can we respond through MyChart? Yes  Agent: Please be advised that Rx refills may take up to 3 business days. We ask that you follow-up with your pharmacy.

## 2024-04-26 ENCOUNTER — Other Ambulatory Visit: Payer: Self-pay | Admitting: Family Medicine

## 2024-04-26 DIAGNOSIS — I10 Essential (primary) hypertension: Secondary | ICD-10-CM

## 2024-04-26 DIAGNOSIS — F32A Depression, unspecified: Secondary | ICD-10-CM

## 2024-04-26 MED ORDER — SERTRALINE HCL 25 MG PO TABS: 25.0000 mg | ORAL_TABLET | Freq: Every day | ORAL | 0 refills | Status: DC

## 2024-04-26 MED ORDER — HYDROCHLOROTHIAZIDE 25 MG PO TABS: 25.0000 mg | ORAL_TABLET | Freq: Every day | ORAL | 0 refills | Status: DC

## 2024-04-26 NOTE — Telephone Encounter (Signed)
 Requested medication (s) are due for refill today: Yes  Requested medication (s) are on the active medication list: Yes  Last refill:    Future visit scheduled: No  Notes to clinic:  Message left to call and make appointment.    Requested Prescriptions  Pending Prescriptions Disp Refills   sertraline  (ZOLOFT ) 25 MG tablet 90 tablet 3    Sig: Take 1 tablet (25 mg total) by mouth daily.     Psychiatry:  Antidepressants - SSRI - sertraline  Failed - 04/26/2024  3:48 PM      Failed - AST in normal range and within 360 days    AST  Date Value Ref Range Status  04/22/2023 9 0 - 40 IU/L Final         Failed - ALT in normal range and within 360 days    ALT  Date Value Ref Range Status  04/22/2023 9 0 - 32 IU/L Final         Failed - Completed PHQ-2 or PHQ-9 in the last 360 days      Failed - Valid encounter within last 6 months    Recent Outpatient Visits   None             hydrochlorothiazide  (HYDRODIURIL ) 25 MG tablet 90 tablet 3    Sig: Take 1 tablet (25 mg total) by mouth daily.     Cardiovascular: Diuretics - Thiazide Failed - 04/26/2024  3:48 PM      Failed - Cr in normal range and within 180 days    Creatinine, Ser  Date Value Ref Range Status  04/22/2023 0.84 0.57 - 1.00 mg/dL Final         Failed - K in normal range and within 180 days    Potassium  Date Value Ref Range Status  04/22/2023 3.7 3.5 - 5.2 mmol/L Final         Failed - Na in normal range and within 180 days    Sodium  Date Value Ref Range Status  04/22/2023 138 134 - 144 mmol/L Final         Failed - Last BP in normal range    BP Readings from Last 1 Encounters:  01/14/24 (!) 137/109         Failed - Valid encounter within last 6 months    Recent Outpatient Visits   None

## 2024-04-26 NOTE — Telephone Encounter (Unsigned)
 Copied from CRM #[id number]. Topic: Clinical - Medication Refill >> Apr 26, 2024  4:00 PM Suzette B wrote: Medication:  sertraline  (ZOLOFT ) 25 MG tablet hydrochlorothiazide  (HYDRODIURIL ) 25 MG tablet Has the patient contacted their pharmacy? Yes: she stated the pharmacy told her they were advised she needed to schedule an appointment with her provide in order to get refill.   This is the patient's preferred pharmacy:  [facility] [phone] [address], [place] - [address] [place] [address] Phone: [phone] Fax: [phone]  Is this the correct pharmacy for this prescription? Yes If no, delete pharmacy and type the correct one.   Has the prescription been filled recently? No 04/22/2023  Is the patient out of the medication? Yes  Has the patient been seen for an appointment in the last year OR does the patient have an upcoming appointment? Yes  Can we respond through MyChart? Yes  Agent: Please be advised that Rx refills may take up to 3 business days. We ask that you follow-up with your pharmacy.

## 2024-04-28 NOTE — Telephone Encounter (Signed)
 Medications refilled 04/26/24. Requested Prescriptions  Refused Prescriptions Disp Refills   sertraline  (ZOLOFT ) 25 MG tablet 90 tablet 3    Sig: Take 1 tablet (25 mg total) by mouth daily.     Psychiatry:  Antidepressants - SSRI - sertraline  Failed - 04/28/2024  2:25 PM      Failed - AST in normal range and within 360 days    AST  Date Value Ref Range Status  04/22/2023 9 0 - 40 IU/L Final         Failed - ALT in normal range and within 360 days    ALT  Date Value Ref Range Status  04/22/2023 9 0 - 32 IU/L Final         Failed - Completed PHQ-2 or PHQ-9 in the last 360 days      Failed - Valid encounter within last 6 months    Recent Outpatient Visits   None             hydrochlorothiazide  (HYDRODIURIL ) 25 MG tablet 90 tablet 3    Sig: Take 1 tablet (25 mg total) by mouth daily.     Cardiovascular: Diuretics - Thiazide Failed - 04/28/2024  2:25 PM      Failed - Cr in normal range and within 180 days    Creatinine, Ser  Date Value Ref Range Status  04/22/2023 0.84 0.57 - 1.00 mg/dL Final         Failed - K in normal range and within 180 days    Potassium  Date Value Ref Range Status  04/22/2023 3.7 3.5 - 5.2 mmol/L Final         Failed - Na in normal range and within 180 days    Sodium  Date Value Ref Range Status  04/22/2023 138 134 - 144 mmol/L Final         Failed - Last BP in normal range    BP Readings from Last 1 Encounters:  01/14/24 (!) 137/109         Failed - Valid encounter within last 6 months    Recent Outpatient Visits   None

## 2024-05-22 ENCOUNTER — Other Ambulatory Visit: Payer: Self-pay | Admitting: Family Medicine

## 2024-05-22 DIAGNOSIS — F32A Depression, unspecified: Secondary | ICD-10-CM

## 2024-05-22 DIAGNOSIS — I10 Essential (primary) hypertension: Secondary | ICD-10-CM

## 2024-05-23 ENCOUNTER — Ambulatory Visit (INDEPENDENT_AMBULATORY_CARE_PROVIDER_SITE_OTHER): Admitting: Family Medicine

## 2024-05-23 VITALS — BP 138/90 | HR 94 | Ht 67.0 in | Wt 238.8 lb

## 2024-05-23 DIAGNOSIS — Z0000 Encounter for general adult medical examination without abnormal findings: Secondary | ICD-10-CM

## 2024-05-23 DIAGNOSIS — J309 Allergic rhinitis, unspecified: Secondary | ICD-10-CM | POA: Diagnosis not present

## 2024-05-23 DIAGNOSIS — D508 Other iron deficiency anemias: Secondary | ICD-10-CM

## 2024-05-23 DIAGNOSIS — G4733 Obstructive sleep apnea (adult) (pediatric): Secondary | ICD-10-CM

## 2024-05-23 DIAGNOSIS — K219 Gastro-esophageal reflux disease without esophagitis: Secondary | ICD-10-CM

## 2024-05-23 DIAGNOSIS — Z23 Encounter for immunization: Secondary | ICD-10-CM | POA: Diagnosis not present

## 2024-05-23 DIAGNOSIS — F32A Depression, unspecified: Secondary | ICD-10-CM

## 2024-05-23 DIAGNOSIS — F419 Anxiety disorder, unspecified: Secondary | ICD-10-CM | POA: Diagnosis not present

## 2024-05-23 DIAGNOSIS — I10 Essential (primary) hypertension: Secondary | ICD-10-CM

## 2024-05-23 MED ORDER — HYDROCHLOROTHIAZIDE 50 MG PO TABS: 50.0000 mg | ORAL_TABLET | Freq: Every day | ORAL | 1 refills | Status: DC

## 2024-05-23 MED ORDER — FLUNISOLIDE 25 MCG/ACT (0.025%) NA SOLN: 2.0000 | Freq: Two times a day (BID) | NASAL | 11 refills | Status: AC

## 2024-05-23 MED ORDER — PANTOPRAZOLE SODIUM 40 MG PO TBEC: 40.0000 mg | DELAYED_RELEASE_TABLET | Freq: Every day | ORAL | 0 refills | Status: AC

## 2024-05-23 MED ORDER — SERTRALINE HCL 25 MG PO TABS: 25.0000 mg | ORAL_TABLET | Freq: Every day | ORAL | 3 refills | Status: AC

## 2024-05-23 NOTE — Assessment & Plan Note (Signed)
 Anxiety symptoms - Increased anxiety over the past month, characterized by feeling on edge, restlessness, and nervousness - Manages anxiety with physical activity, including treadmill use and spending time outdoors - Currently taking a 25 mg dose of Zoloft  without dosage increase  Generalized anxiety disorder and depression Mild increase in anxiety symptoms, with GAD score of 7 and PHQ score of 5. Symptoms include feeling on edge and low energy. Current management includes lifestyle modifications such as exercise and sun exposure. - Continue current Zoloft  25 mg dosage - Encourage continued lifestyle modifications for stress management, including exercise and sun exposure

## 2024-05-24 ENCOUNTER — Encounter: Payer: Self-pay | Admitting: Family Medicine

## 2024-05-24 ENCOUNTER — Ambulatory Visit: Payer: Self-pay | Admitting: Family Medicine

## 2024-05-24 LAB — TSH: TSH: 1.04 u[IU]/mL (ref 0.450–4.500)

## 2024-05-24 LAB — LIPID PANEL
Chol/HDL Ratio: 4.3 ratio (ref 0.0–4.4)
Cholesterol, Total: 164 mg/dL (ref 100–199)
HDL: 38 mg/dL — ABNORMAL LOW (ref >39)
LDL Chol Calc (NIH): 98 mg/dL (ref 0–99)
Triglycerides: 158 mg/dL — ABNORMAL HIGH (ref 0–149)
VLDL Cholesterol Cal: 28 mg/dL (ref 5–40)

## 2024-05-24 LAB — IRON,TIBC AND FERRITIN PANEL
Ferritin: 35 ng/mL (ref 15–150)
Iron Saturation: 26 % (ref 15–55)
Iron: 96 ug/dL (ref 27–159)
Total Iron Binding Capacity: 371 ug/dL (ref 250–450)
UIBC: 275 ug/dL (ref 131–425)

## 2024-05-24 LAB — COMPREHENSIVE METABOLIC PANEL WITH GFR
ALT: 26 IU/L (ref 0–32)
AST: 21 IU/L (ref 0–40)
Albumin: 4.1 g/dL (ref 3.9–4.9)
Alkaline Phosphatase: 57 IU/L (ref 44–121)
BUN/Creatinine Ratio: 10 (ref 9–23)
BUN: 7 mg/dL (ref 6–20)
Bilirubin Total: 0.3 mg/dL (ref 0.0–1.2)
CO2: 24 mmol/L (ref 20–29)
Calcium: 9.5 mg/dL (ref 8.7–10.2)
Chloride: 98 mmol/L (ref 96–106)
Creatinine, Ser: 0.67 mg/dL (ref 0.57–1.00)
Globulin, Total: 2.7 g/dL (ref 1.5–4.5)
Glucose: 201 mg/dL — ABNORMAL HIGH (ref 70–99)
Potassium: 4 mmol/L (ref 3.5–5.2)
Sodium: 138 mmol/L (ref 134–144)
Total Protein: 6.8 g/dL (ref 6.0–8.5)
eGFR: 115 mL/min/1.73 (ref >59)

## 2024-05-24 LAB — CBC WITH DIFFERENTIAL/PLATELET
Basophils Absolute: 0 x10E3/uL (ref 0.0–0.2)
Basos: 0 %
EOS (ABSOLUTE): 0.2 x10E3/uL (ref 0.0–0.4)
Eos: 2 %
Hematocrit: 41.9 % (ref 34.0–46.6)
Hemoglobin: 13.9 g/dL (ref 11.1–15.9)
Immature Grans (Abs): 0 x10E3/uL (ref 0.0–0.1)
Immature Granulocytes: 0 %
Lymphocytes Absolute: 2.7 x10E3/uL (ref 0.7–3.1)
Lymphs: 26 %
MCH: 30.1 pg (ref 26.6–33.0)
MCHC: 33.2 g/dL (ref 31.5–35.7)
MCV: 91 fL (ref 79–97)
Monocytes Absolute: 0.6 x10E3/uL (ref 0.1–0.9)
Monocytes: 6 %
Neutrophils Absolute: 6.8 x10E3/uL (ref 1.4–7.0)
Neutrophils: 66 %
Platelets: 378 x10E3/uL (ref 150–450)
RBC: 4.62 x10E6/uL (ref 3.77–5.28)
RDW: 12.7 % (ref 11.7–15.4)
WBC: 10.3 x10E3/uL (ref 3.4–10.8)

## 2024-05-24 LAB — HEMOGLOBIN A1C
Est. average glucose Bld gHb Est-mCnc: 258 mg/dL
Hgb A1c MFr Bld: 10.6 % — ABNORMAL HIGH (ref 4.8–5.6)

## 2024-05-24 MED ORDER — METFORMIN HCL ER 500 MG PO TB24: 500.0000 mg | ORAL_TABLET | Freq: Every day | ORAL | 0 refills | Status: DC

## 2024-05-24 NOTE — Progress Notes (Signed)
 Annual Physical Exam Visit  Patient Information:  Patient ID: Jodi Conner, female DOB: 12/20/1985 Age: 38 y.o. MRN: [id number]   Subjective:   CC: Annual Physical Exam  HPI:  Jodi Conner is here for their annual physical.  I reviewed the past medical history, family history, social history, surgical history, and allergies today and changes were made as necessary.  Please see the problem list section below for additional details.  Past Medical History: Past Medical History:  Diagnosis Date   Allergy    Anxiety    Asthma    BRCA negative 01/2024   MyRisk neg except POLE and ATM VUS; IBIS=11.9%/riskscore=12.3%   Family history of ovarian cancer    GERD (gastroesophageal reflux disease)    Hypertension    Past Surgical History: History reviewed. No pertinent surgical history. Family History: Family History  Problem Relation Age of Onset   Alcohol abuse Mother    Drug abuse Mother    Alcohol abuse Father    Healthy Sister    Healthy Sister    Healthy Brother    Ovarian cancer Maternal Grandmother    Cancer Maternal Grandmother    Healthy Maternal Grandfather    Healthy Paternal Grandmother    Healthy Paternal Grandfather    Cancer Maternal Aunt    Allergies: No Known Allergies Health Maintenance: Health Maintenance  Topic Date Due   Hepatitis B Vaccines 19-59 Average Risk (1 of 3 - 19+ 3-dose series) 05/23/2025 (Originally 02/15/2006)   Cervical Cancer Screening (HPV/Pap Cotest)  04/30/2026   DTaP/Tdap/Td (2 - Td or Tdap) 04/15/2031   INFLUENZA VACCINE  Completed   Hepatitis C Screening  Completed   HIV Screening  Completed   Pneumococcal Vaccine  Aged Out   Meningococcal B Vaccine  Aged Out   HPV VACCINES  Discontinued   COVID-19 Vaccine  Discontinued    HM Colonoscopy   This patient has no relevant Health Maintenance data.    Medications: Current Outpatient Medications on File Prior to Visit  Medication Sig Dispense Refill   esomeprazole  (NEXIUM) 20 MG capsule Take 20 mg by mouth daily at 12 noon.     fexofenadine (ALLEGRA) 180 MG tablet Take 180 mg by mouth daily.     Multiple Vitamin (MULTI VITAMIN DAILY PO) daily.     Norethindrone-Ethinyl Estradiol -Fe Biphas (LO LOESTRIN FE ) 1 MG-10 MCG / 10 MCG tablet Take 1 tablet by mouth daily. 28 tablet 12   No current facility-administered medications on file prior to visit.    Objective:   Vitals:   05/23/24 1044  BP: (!) 138/90  Pulse: 94  SpO2: 100%   Vitals:   05/23/24 1044  Weight: 238 lb 12.8 oz (108.3 kg)  Height: 5' 7 (1.702 m)   Body mass index is 37.4 kg/m.  General: Well Developed, well nourished, and in no acute distress.  Neuro: Alert and oriented x3, extra-ocular muscles intact, sensation grossly intact. Cranial nerves II through XII are grossly intact, motor, sensory, and coordinative functions are intact. HEENT: Normocephalic, atraumatic, neck supple, no masses, no lymphadenopathy, thyroid nonenlarged. Oropharynx, nasopharynx, external ear canals are unremarkable. Skin: Warm and dry, no rashes noted.  Cardiac: Regular rate and rhythm, no murmurs rubs or gallops. No peripheral edema. Pulses symmetric. Respiratory: Clear to auscultation bilaterally. Speaking in full sentences.  Abdominal: Soft, nontender, nondistended, positive bowel sounds, no masses, no organomegaly. Musculoskeletal: Stable, and with full range of motion.  Impression and Recommendations:   The patient was counselled, risk  factors were discussed, and anticipatory guidance given.  Problem List Items Addressed This Visit     Allergic rhinitis   Allergic rhinitis Allergic rhinitis managed with intranasal antihistamine, which she finds more effective than over-the-counter options. - Refill prescription for intranasal nasalide       Relevant Medications   flunisolide  (NASALIDE ) 25 MCG/ACT (0.025%) SOLN   Anxiety and depression   Anxiety symptoms - Increased anxiety over the past  month, characterized by feeling on edge, restlessness, and nervousness - Manages anxiety with physical activity, including treadmill use and spending time outdoors - Currently taking a 25 mg dose of Zoloft  without dosage increase  Generalized anxiety disorder and depression Mild increase in anxiety symptoms, with GAD score of 7 and PHQ score of 5. Symptoms include feeling on edge and low energy. Current management includes lifestyle modifications such as exercise and sun exposure. - Continue current Zoloft  25 mg dosage - Encourage continued lifestyle modifications for stress management, including exercise and sun exposure      Relevant Medications   sertraline  (ZOLOFT ) 25 MG tablet   Gastroesophageal reflux disease   Gastroesophageal reflux disease with recent symptom exacerbation Recent exacerbation of gastroesophageal reflux disease symptoms over the past month, possibly related to anxiety or dietary habits.  - Prescribe Protonix  to replace Nexium, to be taken once daily on an empty stomach - Discuss potential lifestyle factors contributing to symptoms, such as dietary habits over the weekend and stress from returning to work      Relevant Medications   pantoprazole  (PROTONIX ) 40 MG tablet   Healthcare maintenance - Primary   Annual examination completed, risk stratification labs ordered, anticipatory guidance provided.  We will follow labs once resulted.      Relevant Orders   CBC with Differential/Platelet (Completed)   Comprehensive metabolic panel with GFR (Completed)   Lipid panel (Completed)   TSH (Completed)   Hemoglobin A1c (Completed)   Hypertension   Relevant Medications   hydrochlorothiazide  (HYDRODIURIL ) 50 MG tablet   Iron  deficiency anemia (Chronic)   Relevant Orders   Iron , TIBC and Ferritin Panel (Completed)   OSA (obstructive sleep apnea)   Obstructive sleep apnea, untreated Diagnosis of obstructive sleep apnea remains untreated. Discussed potential benefits  of CPAP therapy - Refer to Pulmonary for CPAP therapy evaluation - Advise her to obtain previous sleep study records if available      Relevant Orders   Ambulatory referral to Sleep Studies   Other Visit Diagnoses       Immunization due         Encounter for immunization       Relevant Orders   Flu vaccine trivalent PF, 6mos and older(Flulaval,Afluria,Fluarix,Fluzone) (Completed)        Orders & Medications Medications:  Meds ordered this encounter  Medications   flunisolide  (NASALIDE ) 25 MCG/ACT (0.025%) SOLN    Sig: Place 2 sprays into the nose 2 (two) times daily.    Dispense:  25 mL    Refill:  11   sertraline  (ZOLOFT ) 25 MG tablet    Sig: Take 1 tablet (25 mg total) by mouth daily.    Dispense:  90 tablet    Refill:  3   hydrochlorothiazide  (HYDRODIURIL ) 50 MG tablet    Sig: Take 1 tablet (50 mg total) by mouth daily.    Dispense:  30 tablet    Refill:  1   pantoprazole  (PROTONIX ) 40 MG tablet    Sig: Take 1 tablet (40 mg total) by mouth daily.  Dispense:  90 tablet    Refill:  0   Orders Placed This Encounter  Procedures   Flu vaccine trivalent PF, 6mos and older(Flulaval,Afluria,Fluarix,Fluzone)   CBC with Differential/Platelet   Comprehensive metabolic panel with GFR   Lipid panel   TSH   Hemoglobin A1c   Iron , TIBC and Ferritin Panel   Ambulatory referral to Sleep Studies     No follow-ups on file.    Selinda JINNY Ku, MD, [facility]   Primary Care Sports Medicine Primary Care and Sports Medicine at [facility]

## 2024-05-24 NOTE — Assessment & Plan Note (Signed)
 Gastroesophageal reflux disease with recent symptom exacerbation Recent exacerbation of gastroesophageal reflux disease symptoms over the past month, possibly related to anxiety or dietary habits.  - Prescribe Protonix  to replace Nexium, to be taken once daily on an empty stomach - Discuss potential lifestyle factors contributing to symptoms, such as dietary habits over the weekend and stress from returning to work

## 2024-05-24 NOTE — Assessment & Plan Note (Signed)
 Obstructive sleep apnea, untreated Diagnosis of obstructive sleep apnea remains untreated. Discussed potential benefits of CPAP therapy - Refer to Pulmonary for CPAP therapy evaluation - Advise her to obtain previous sleep study records if available

## 2024-05-24 NOTE — Assessment & Plan Note (Signed)
 Allergic rhinitis Allergic rhinitis managed with intranasal antihistamine, which she finds more effective than over-the-counter options. - Refill prescription for intranasal nasalide 

## 2024-05-24 NOTE — Assessment & Plan Note (Signed)
 Annual examination completed, risk stratification labs ordered, anticipatory guidance provided.  We will follow labs once resulted.

## 2024-05-24 NOTE — Patient Instructions (Addendum)
-   Obtain fasting labs with orders provided (can have water or black coffee but otherwise no food or drink x 8 hours before labs) - Review information provided - Attend eye doctor annually, dentist every 6 months, work towards or maintain 30 minutes of moderate intensity physical activity at least 5 days per week, and consume a balanced diet - Return in 1 year for physical - Contact us  for any questions between now and then   Allergic Rhinitis  - Refill and continue using intranasal nasalide  as prescribed.  Anxiety and Depression  - Continue Zoloft  25 mg daily. - Maintain regular exercise and spend time outdoors for stress management.  Gastroesophageal Reflux Disease  - Start Protonix  once daily on an empty stomach, replacing Nexium. - Monitor and adjust dietary habits, especially on weekends. - Manage stress as it may affect reflux symptoms.  Hypertension  - Continue current blood pressure medication as prescribed.  Iron  Deficiency Anemia  - Iron , TIBC, and ferritin panel completed; follow up as directed.  Obstructive Sleep Apnea  - Referral placed to Pulmonary for CPAP therapy evaluation. - Obtain and provide previous sleep study records if available.  Red flags to watch for:  - Sudden or severe shortness of breath - Chest pain or palpitations - Severe or persistent abdominal pain, vomiting blood, or black stools - Worsening depression, thoughts of self-harm, or inability to cope - Severe allergic reactions (swelling, difficulty breathing)  If any of these symptoms occur, seek medical attention promptly.

## 2024-06-16 ENCOUNTER — Other Ambulatory Visit: Payer: Self-pay | Admitting: Family Medicine

## 2024-06-16 DIAGNOSIS — I10 Essential (primary) hypertension: Secondary | ICD-10-CM

## 2024-06-19 NOTE — Telephone Encounter (Signed)
 Requested medications are due for refill today.  Metformin  is due for refill. Hydrochlorothiazide  is too soon  Requested medications are on the active medications list.  yes  Last refill. Metformin  05/24/2024 #30 0 rf, hydrochlorothiazide  05/23/2024 #30 1 rf  Future visit scheduled.   tomorrow  Notes to clinic.  Metformin  is a new medication to this pt. PT is requesting a 90 day supply for both of these medications.    Requested Prescriptions  Pending Prescriptions Disp Refills   metFORMIN  (GLUCOPHAGE -XR) 500 MG 24 hr tablet [Pharmacy Med Name: METFORMIN  HCL ER 500 MG TABLET] 90 tablet 1    Sig: TAKE 1 TABLET BY MOUTH EVERY DAY WITH BREAKFAST     Endocrinology:  Diabetes - Biguanides Failed - 06/19/2024  5:50 PM      Failed - HBA1C is between 0 and 7.9 and within 180 days    Hgb A1c MFr Bld  Date Value Ref Range Status  05/23/2024 10.6 (H) 4.8 - 5.6 % Final    Comment:             Prediabetes: 5.7 - 6.4          Diabetes: >6.4          Glycemic control for adults with diabetes: <7.0          Failed - B12 Level in normal range and within 720 days    No results found for: VITAMINB12       Passed - Cr in normal range and within 360 days    Creatinine, Ser  Date Value Ref Range Status  05/23/2024 0.67 0.57 - 1.00 mg/dL Final         Passed - eGFR in normal range and within 360 days    GFR, Estimated  Date Value Ref Range Status  09/23/2022 >60 >60 mL/min Final    Comment:    (NOTE) Calculated using the CKD-EPI Creatinine Equation (2021)    eGFR  Date Value Ref Range Status  05/23/2024 115 >59 mL/min/1.73 Final         Passed - Valid encounter within last 6 months    Recent Outpatient Visits           3 weeks ago Healthcare maintenance   [facility] Primary Care & Sports Medicine at [facility] Alvia, Selinda PARAS, MD       Future Appointments             Tomorrow Alvia Selinda PARAS, MD [facility] Primary Care & Sports Medicine at [facility], [place]  [place]   In 10 months Alvia, Selinda PARAS, MD [facility] Primary Care & Sports Medicine at [facility], [phone] [place]            Passed - CBC within normal limits and completed in the last 12 months    WBC  Date Value Ref Range Status  05/23/2024 10.3 3.4 - 10.8 x10E3/uL Final  09/23/2022 15.6 (H) 4.0 - 10.5 K/uL Final   RBC  Date Value Ref Range Status  05/23/2024 4.62 3.77 - 5.28 x10E6/uL Final  09/23/2022 4.57 3.87 - 5.11 MIL/uL Final   Hemoglobin  Date Value Ref Range Status  05/23/2024 13.9 11.1 - 15.9 g/dL Final   Hematocrit  Date Value Ref Range Status  05/23/2024 41.9 34.0 - 46.6 % Final   MCHC  Date Value Ref Range Status  05/23/2024 33.2 31.5 - 35.7 g/dL Final  98/96/7975 65.3 30.0 - 36.0 g/dL Final   [facility]  Date Value  Ref Range Status  05/23/2024 30.1 26.6 - 33.0 pg Final  09/23/2022 29.8 26.0 - 34.0 pg Final   MCV  Date Value Ref Range Status  05/23/2024 91 79 - 97 fL Final   No results found for: PLTCOUNTKUC, LABPLAT, POCPLA RDW  Date Value Ref Range Status  05/23/2024 12.7 11.7 - 15.4 % Final          hydrochlorothiazide  (HYDRODIURIL ) 50 MG tablet [Pharmacy Med Name: HYDROCHLOROTHIAZIDE  50 MG TAB] 90 tablet 1    Sig: TAKE 1 TABLET BY MOUTH EVERY DAY     Cardiovascular: Diuretics - Thiazide Failed - 06/19/2024  5:50 PM      Failed - Last BP in normal range    BP Readings from Last 1 Encounters:  05/23/24 (!) 138/90         Passed - Cr in normal range and within 180 days    Creatinine, Ser  Date Value Ref Range Status  05/23/2024 0.67 0.57 - 1.00 mg/dL Final         Passed - K in normal range and within 180 days    Potassium  Date Value Ref Range Status  05/23/2024 4.0 3.5 - 5.2 mmol/L Final         Passed - Na in normal range and within 180 days    Sodium  Date Value Ref Range Status  05/23/2024 138 134 - 144 mmol/L Final         Passed - Valid encounter within last 6 months    Recent Outpatient Visits           3  weeks ago Healthcare maintenance   [facility] Primary Care & Sports Medicine at [facility], Selinda PARAS, MD       Future Appointments             Tomorrow Alvia Selinda PARAS, MD [facility] Health Primary Care & Sports Medicine at [facility], [place]   In 10 months Alvia, Selinda PARAS, MD [facility] Health Primary Care & Sports Medicine at [facility], [phone] [place]

## 2024-06-20 ENCOUNTER — Ambulatory Visit: Admitting: Family Medicine

## 2024-06-20 ENCOUNTER — Encounter: Payer: Self-pay | Admitting: Family Medicine

## 2024-06-20 ENCOUNTER — Other Ambulatory Visit: Payer: Self-pay

## 2024-06-20 VITALS — BP 134/90 | HR 99 | Ht 67.0 in | Wt 234.0 lb

## 2024-06-20 DIAGNOSIS — I10 Essential (primary) hypertension: Secondary | ICD-10-CM | POA: Diagnosis not present

## 2024-06-20 DIAGNOSIS — H1013 Acute atopic conjunctivitis, bilateral: Secondary | ICD-10-CM | POA: Diagnosis not present

## 2024-06-20 DIAGNOSIS — F32A Depression, unspecified: Secondary | ICD-10-CM

## 2024-06-20 DIAGNOSIS — L509 Urticaria, unspecified: Secondary | ICD-10-CM | POA: Insufficient documentation

## 2024-06-20 DIAGNOSIS — F419 Anxiety disorder, unspecified: Secondary | ICD-10-CM

## 2024-06-20 DIAGNOSIS — Z7984 Long term (current) use of oral hypoglycemic drugs: Secondary | ICD-10-CM

## 2024-06-20 MED ORDER — METHYLPREDNISOLONE 4 MG PO TBPK: ORAL_TABLET | ORAL | 0 refills | Status: AC

## 2024-06-20 MED ORDER — OLOPATADINE HCL 0.2 % OP SOLN: 1.0000 [drp] | Freq: Two times a day (BID) | OPHTHALMIC | 0 refills | Status: AC

## 2024-06-20 MED ORDER — MONTELUKAST SODIUM 10 MG PO TABS: 10.0000 mg | ORAL_TABLET | Freq: Every day | ORAL | 0 refills | Status: AC

## 2024-06-20 MED ORDER — EPINEPHRINE 0.3 MG/0.3ML IJ SOAJ: 0.3000 mg | INTRAMUSCULAR | 0 refills | Status: AC | PRN

## 2024-06-20 NOTE — Assessment & Plan Note (Signed)
 Ocular erythema and pruritus - Red eyes present for two weeks, coinciding with onset of ragweed season - Temporary relief with Zyrtec, nasal spray, Benadryl, and over-the-counter eye drops, with Benadryl providing the most relief - No associated respiratory symptoms, gastrointestinal issues, or other systemic allergic reactions   Allergic conjunctivitis and dermatitis Acute allergic conjunctivitis and dermatitis with persistent symptoms despite treatment. Possible environmental allergies exacerbated by outdoor exposure. - Prescribe antihistamine eye drops. - Continue Zyrtec, nasal spray, and Flonase. - Start Singulair with informed consent on side effects. - Prescribe prednisone Medrol  pack if no improvement by weekend. - Refer to allergist for evaluation and testing. - Prescribe EpiPen for emergency respiratory symptoms. - Advise sending rash photos via MyChart for allergist review.

## 2024-06-20 NOTE — Patient Instructions (Addendum)
 VISIT SUMMARY:  Today, we addressed your red eyes and spreading rash, which are likely due to allergies. We also discussed your hypertension, type 2 diabetes, PCOS, and depression management.  YOUR PLAN:  ALLERGIC CONJUNCTIVITIS AND DERMATITIS: You have red eyes and a spreading rash likely due to allergies. -Start using antihistamine eye drops. -Continue taking Zyrtec, nasal spray, and Flonase. -Restart Singulair, and be aware of potential side effects. -If symptoms do not improve by the weekend, or worsen, start the prescribed prednisone Medrol  pack. -You will be referred to an allergist for further evaluation and testing. -An EpiPen has been prescribed for emergency respiratory symptoms. -Send photos of your rash via MyChart for the allergist to review.  HYPERTENSION -Monitor your blood pressure and we will reassess during your follow-up visit.  TYPE 2 DIABETES MELLITUS: Your recent A1c levels have been elevated. -We will recheck your A1c in three months. -We will discuss starting GLP-1 receptor agonists after your allergy symptoms are managed. - Contact us  if wanting to start this medication.  POLYCYSTIC OVARY SYNDROME (PCOS): You have PCOS, which affects your blood sugar levels. -We will consider GLP-1 receptor agonists after your allergy symptoms are managed.  MOOD: You are currently managing symptoms with sertraline . -If you experience significant jitteriness from the steroids, increase your sertraline  to 50 mg.

## 2024-06-20 NOTE — Assessment & Plan Note (Signed)
 Managed with sertraline . Discussed potential stress and mood changes with Singulair and prednisone. - Increase sertraline  to 50 mg if significant jitteriness from steroids.

## 2024-06-20 NOTE — Assessment & Plan Note (Signed)
 Pruritic rash - Rash began under the arms and spread to chest, neck, and face - Rash is more severe and pruritic than prior heat rash - No new exposures to potential allergens such as laundry detergents, lotions, or shampoos - Witch hazel provides some relief - Topical Benadryl cream worsened the rash  Allergic disease management - Current allergy regimen includes Flonase - History of using Singulair, which was effective in the past - Benadryl extra strength taken for symptom management  Physical Exam HEENT: Oropharynx benign.  Nasopharynx without erythema or inflammation, tympanic membranes and canals benign bilaterally. NECK: No lymphadenopathy. CHEST: Lungs clear to auscultation bilaterally.  No wheezes, rales, rhonchi. CARDIOVASCULAR: Heart regular rate and rhythm.  Positive S1 and S2, no MRG  Allergic conjunctivitis and dermatitis Acute allergic conjunctivitis and dermatitis with persistent symptoms despite treatment. Possible environmental allergies exacerbated by outdoor exposure. - Prescribe antihistamine eye drops. - Continue Zyrtec, nasal spray, and Flonase. - Start Singulair with informed consent on side effects. - Prescribe prednisone Medrol  pack if no improvement by weekend. - Refer to allergist for evaluation and testing. - Prescribe EpiPen for emergency respiratory symptoms. - Advise sending rash photos via MyChart for allergist review.

## 2024-06-20 NOTE — Progress Notes (Signed)
 Primary Care / Sports Medicine Office Visit  Patient Information:  Patient ID: Jodi Conner, female DOB: [birth year] Age: 38 y.o. MRN: [id number]   Jodi Conner is a pleasant 38 y.o. female presenting with the following:  Chief Complaint  Patient presents with   Hypertension   Rash    Allergic reaction since 06/10/24  to something, not sure what it is. Possible reaction to rag weed. Patient has rash around eyes, neck , chest, and underarm area. Rash is very itchy and bumpy.    Vitals:   06/20/24 1039 06/20/24 1048  BP: (!) 136/90 (!) 134/90  Pulse: (!) 102 99  SpO2: 98% 99%   Vitals:   06/20/24 1039  Weight: 234 lb (106.1 kg)  Height: 5' 7 (1.702 m)   Body mass index is 36.65 kg/m.  No results found.   Discussed the use of AI scribe software for clinical note transcription with the patient, who gave verbal consent to proceed.   Independent interpretation of notes and tests performed by another provider:   None  Procedures performed:   None  Pertinent History, Exam, Impression, and Recommendations:   Problem List Items Addressed This Visit     Allergic conjunctivitis of both eyes   Ocular erythema and pruritus - Red eyes present for two weeks, coinciding with onset of ragweed season - Temporary relief with Zyrtec, nasal spray, Benadryl, and over-the-counter eye drops, with Benadryl providing the most relief - No associated respiratory symptoms, gastrointestinal issues, or other systemic allergic reactions  Allergic conjunctivitis and dermatitis Acute allergic conjunctivitis and dermatitis with persistent symptoms despite treatment. Possible environmental allergies exacerbated by outdoor exposure. - Prescribe antihistamine eye drops. - Continue Zyrtec, nasal spray, and Flonase. - Start Singulair with informed consent on side effects. - Prescribe prednisone Medrol  pack if no improvement by weekend. - Refer to allergist for evaluation and testing. -  Prescribe EpiPen for emergency respiratory symptoms. - Advise sending rash photos via MyChart for allergist review.      Anxiety and depression   Managed with sertraline . Discussed potential stress and mood changes with Singulair and prednisone. - Increase sertraline  to 50 mg if significant jitteriness from steroids.      Hypertension   Hypertension Hypertension likely secondary to allergic reaction. - Monitor blood pressure and reassess in follow-up.      Relevant Medications   EPINEPHrine (EPIPEN 2-PAK) 0.3 mg/0.3 mL IJ SOAJ injection   Long term current use of oral hypoglycemic drug   Type 2 diabetes mellitus Type 2 diabetes with recent A1c elevation. Consider GLP-1 receptor agonists post-allergy management. - Recheck A1c in three months at follow-up/ - Discuss GLP-1 receptor agonists after allergy resolution.      Urticarial dermatitis - Primary   Pruritic rash - Rash began under the arms and spread to chest, neck, and face - Rash is more severe and pruritic than prior heat rash - No new exposures to potential allergens such as laundry detergents, lotions, or shampoos - Witch hazel provides some relief - Topical Benadryl cream worsened the rash  Allergic disease management - Current allergy regimen includes Flonase - History of using Singulair, which was effective in the past - Benadryl extra strength taken for symptom management  Physical Exam HEENT: Oropharynx benign.  Nasopharynx without erythema or inflammation, tympanic membranes and canals benign bilaterally. NECK: No lymphadenopathy. CHEST: Lungs clear to auscultation bilaterally.  No wheezes, rales, rhonchi. CARDIOVASCULAR: Heart regular rate and rhythm.  Positive S1 and S2, no MRG  Allergic conjunctivitis and dermatitis Acute allergic conjunctivitis and dermatitis with persistent symptoms despite treatment. Possible environmental allergies exacerbated by outdoor exposure. - Prescribe antihistamine eye  drops. - Continue Zyrtec, nasal spray, and Flonase. - Start Singulair with informed consent on side effects. - Prescribe prednisone Medrol  pack if no improvement by weekend. - Refer to allergist for evaluation and testing. - Prescribe EpiPen for emergency respiratory symptoms. - Advise sending rash photos via MyChart for allergist review.       Relevant Orders   Ambulatory referral to Allergy     Orders & Medications Medications:  Meds ordered this encounter  Medications   EPINEPHrine (EPIPEN 2-PAK) 0.3 mg/0.3 mL IJ SOAJ injection    Sig: Inject 0.3 mg into the muscle as needed for anaphylaxis.    Dispense:  2 each    Refill:  0    Dispense 2 pens   montelukast (SINGULAIR) 10 MG tablet    Sig: Take 1 tablet (10 mg total) by mouth at bedtime.    Dispense:  90 tablet    Refill:  0   methylPREDNISolone  (MEDROL  DOSEPAK) 4 MG TBPK tablet    Sig: Take for full course per package instructions    Dispense:  21 tablet    Refill:  0   Olopatadine HCl 0.2 % SOLN    Sig: Apply 1 drop to eye 2 (two) times daily.    Dispense:  3.5 mL    Refill:  0   Orders Placed This Encounter  Procedures   Ambulatory referral to Allergy     Return in about 9 weeks (around 08/22/2024).     Jodi JINNY Ku, MD, [facility]   Primary Care Sports Medicine Primary Care and Sports Medicine at [facility]

## 2024-06-20 NOTE — Assessment & Plan Note (Signed)
 Type 2 diabetes mellitus Type 2 diabetes with recent A1c elevation. Consider GLP-1 receptor agonists post-allergy management. - Recheck A1c in three months at follow-up/ - Discuss GLP-1 receptor agonists after allergy resolution.

## 2024-06-20 NOTE — Assessment & Plan Note (Signed)
 Hypertension Hypertension likely secondary to allergic reaction. - Monitor blood pressure and reassess in follow-up.

## 2024-07-18 ENCOUNTER — Other Ambulatory Visit: Payer: Self-pay | Admitting: Family Medicine

## 2024-07-18 DIAGNOSIS — I10 Essential (primary) hypertension: Secondary | ICD-10-CM

## 2024-07-19 NOTE — Telephone Encounter (Signed)
 Requested Prescriptions  Pending Prescriptions Disp Refills   hydrochlorothiazide  (HYDRODIURIL ) 50 MG tablet [Pharmacy Med Name: HYDROCHLOROTHIAZIDE  50 MG TAB] 90 tablet 0    Sig: TAKE 1 TABLET BY MOUTH EVERY DAY     Cardiovascular: Diuretics - Thiazide Failed - 07/19/2024  3:51 PM      Failed - Last BP in normal range    BP Readings from Last 1 Encounters:  06/20/24 (!) 134/90         Passed - Cr in normal range and within 180 days    Creatinine, Ser  Date Value Ref Range Status  05/23/2024 0.67 0.57 - 1.00 mg/dL Final         Passed - K in normal range and within 180 days    Potassium  Date Value Ref Range Status  05/23/2024 4.0 3.5 - 5.2 mmol/L Final         Passed - Na in normal range and within 180 days    Sodium  Date Value Ref Range Status  05/23/2024 138 134 - 144 mmol/L Final         Passed - Valid encounter within last 6 months    Recent Outpatient Visits           4 weeks ago Urticarial dermatitis   [place] [facility], Selinda PARAS, MD   1 month ago Healthcare maintenance   [facility] at [facility], Selinda PARAS, MD       Future Appointments             In 9 months Ku, Selinda PARAS, MD [facility] at [facility], [phone] [place]

## 2024-08-12 ENCOUNTER — Other Ambulatory Visit: Payer: Self-pay | Admitting: Family Medicine

## 2024-08-12 DIAGNOSIS — K219 Gastro-esophageal reflux disease without esophagitis: Secondary | ICD-10-CM

## 2024-08-14 NOTE — Telephone Encounter (Signed)
 Requested Prescriptions  Refused Prescriptions Disp Refills   pantoprazole  (PROTONIX ) 40 MG tablet [Pharmacy Med Name: PANTOPRAZOLE  SOD DR 40 MG TAB] 90 tablet 0    Sig: TAKE 1 TABLET BY MOUTH EVERY DAY     Gastroenterology: Proton Pump Inhibitors Passed - 08/14/2024  1:30 PM      Passed - Valid encounter within last 12 months    Recent Outpatient Visits           1 month ago Urticarial dermatitis    [facility], Selinda PARAS, MD   2 months ago Healthcare maintenance   [facility] at [facility], Selinda PARAS, MD       Future Appointments             In 8 months Ku, Selinda PARAS, MD [facility] at [facility], [phone] [place]

## 2024-09-04 ENCOUNTER — Ambulatory Visit: Admitting: Family Medicine

## 2024-09-24 ENCOUNTER — Other Ambulatory Visit: Payer: Self-pay | Admitting: Family Medicine

## 2024-09-26 NOTE — Telephone Encounter (Signed)
 Requested Prescriptions  Pending Prescriptions Disp Refills   metFORMIN  (GLUCOPHAGE -XR) 500 MG 24 hr tablet [Pharmacy Med Name: METFORMIN  HCL ER 500 MG TABLET] 90 tablet 2    Sig: TAKE 1 TABLET BY MOUTH EVERY DAY WITH BREAKFAST     Endocrinology:  Diabetes - Biguanides Failed - 09/26/2024 12:09 PM      Failed - HBA1C is between 0 and 7.9 and within 180 days    Hgb A1c MFr Bld  Date Value Ref Range Status  05/23/2024 10.6 (H) 4.8 - 5.6 % Final    Comment:             Prediabetes: 5.7 - 6.4          Diabetes: >6.4          Glycemic control for adults with diabetes: <7.0          Failed - B12 Level in normal range and within 720 days    No results found for: VITAMINB12       Passed - Cr in normal range and within 360 days    Creatinine, Ser  Date Value Ref Range Status  05/23/2024 0.67 0.57 - 1.00 mg/dL Final         Passed - eGFR in normal range and within 360 days    GFR, Estimated  Date Value Ref Range Status  09/23/2022 >60 >60 mL/min Final    Comment:    (NOTE) Calculated using the CKD-EPI Creatinine Equation (2021)    eGFR  Date Value Ref Range Status  05/23/2024 115 >59 mL/min/1.73 Final         Passed - Valid encounter within last 6 months    Recent Outpatient Visits           3 months ago Urticarial dermatitis   [place] [facility], Selinda PARAS, MD   4 months ago Healthcare maintenance    [facility], Selinda PARAS, MD       Future Appointments             In 7 months Ku, Selinda PARAS, MD [facility] at [facility], [phone] [place]            Passed - CBC within normal limits and completed in the last 12 months    WBC  Date Value Ref Range Status  05/23/2024 10.3 3.4 - 10.8 x10E3/uL Final  09/23/2022 15.6 (H) 4.0 - 10.5 K/uL Final   RBC  Date Value Ref Range Status  05/23/2024 4.62 3.77 - 5.28 x10E6/uL Final   09/23/2022 4.57 3.87 - 5.11 MIL/uL Final   Hemoglobin  Date Value Ref Range Status  05/23/2024 13.9 11.1 - 15.9 g/dL Final   Hematocrit  Date Value Ref Range Status  05/23/2024 41.9 34.0 - 46.6 % Final   MCHC  Date Value Ref Range Status  05/23/2024 33.2 31.5 - 35.7 g/dL Final  98/96/7975 65.3 30.0 - 36.0 g/dL Final   [facility]  Date Value Ref Range Status  05/23/2024 30.1 26.6 - 33.0 pg Final  09/23/2022 29.8 26.0 - 34.0 pg Final   MCV  Date Value Ref Range Status  05/23/2024 91 79 - 97 fL Final   No results found for: PLTCOUNTKUC, LABPLAT, POCPLA RDW  Date Value Ref Range Status  05/23/2024 12.7 11.7 - 15.4 % Final

## 2024-10-20 ENCOUNTER — Other Ambulatory Visit: Payer: Self-pay | Admitting: Family Medicine

## 2024-10-20 DIAGNOSIS — I10 Essential (primary) hypertension: Secondary | ICD-10-CM

## 2024-10-20 NOTE — Telephone Encounter (Signed)
 Requested Prescriptions  Pending Prescriptions Disp Refills   hydrochlorothiazide  (HYDRODIURIL ) 50 MG tablet [Pharmacy Med Name: HYDROCHLOROTHIAZIDE  50 MG TAB] 90 tablet 0    Sig: TAKE 1 TABLET BY MOUTH EVERY DAY     Cardiovascular: Diuretics - Thiazide Failed - 10/20/2024  4:17 PM      Failed - Last BP in normal range    BP Readings from Last 1 Encounters:  06/20/24 (!) 134/90         Passed - Cr in normal range and within 180 days    Creatinine, Ser  Date Value Ref Range Status  05/23/2024 0.67 0.57 - 1.00 mg/dL Final         Passed - K in normal range and within 180 days    Potassium  Date Value Ref Range Status  05/23/2024 4.0 3.5 - 5.2 mmol/L Final         Passed - Na in normal range and within 180 days    Sodium  Date Value Ref Range Status  05/23/2024 138 134 - 144 mmol/L Final         Passed - Valid encounter within last 6 months    Recent Outpatient Visits           4 months ago Urticarial dermatitis   [place] [facility], Selinda PARAS, MD   5 months ago Healthcare maintenance   [facility] at [facility], Jason J, MD

## 2025-05-07 ENCOUNTER — Encounter: Admitting: Family Medicine
